# Patient Record
Sex: Male | Born: 1990 | Race: White | Hispanic: No | Marital: Single | Smoking: Never smoker
Health system: Southern US, Community
[De-identification: ages and names within clinical notes are randomized; demographics above are authoritative.]

## PROBLEM LIST (undated history)

## (undated) DIAGNOSIS — T82110A Breakdown (mechanical) of cardiac electrode, initial encounter: Secondary | ICD-10-CM

## (undated) DIAGNOSIS — I509 Heart failure, unspecified: Secondary | ICD-10-CM

## (undated) DIAGNOSIS — I472 Ventricular tachycardia, unspecified: Secondary | ICD-10-CM

## (undated) DIAGNOSIS — T827XXA Infection and inflammatory reaction due to other cardiac and vascular devices, implants and grafts, initial encounter: Secondary | ICD-10-CM

## (undated) DIAGNOSIS — Q251 Coarctation of aorta: Secondary | ICD-10-CM

## (undated) DIAGNOSIS — E669 Obesity, unspecified: Secondary | ICD-10-CM

## (undated) DIAGNOSIS — Z95810 Presence of automatic (implantable) cardiac defibrillator: Secondary | ICD-10-CM

## (undated) DIAGNOSIS — I420 Dilated cardiomyopathy: Secondary | ICD-10-CM

## (undated) DIAGNOSIS — I10 Essential (primary) hypertension: Secondary | ICD-10-CM

## (undated) DIAGNOSIS — I519 Heart disease, unspecified: Secondary | ICD-10-CM

## (undated) DIAGNOSIS — I4891 Unspecified atrial fibrillation: Secondary | ICD-10-CM

## (undated) DIAGNOSIS — I5189 Other ill-defined heart diseases: Secondary | ICD-10-CM

## (undated) HISTORY — PX: ICD IMPLANT: EP1208

## (undated) HISTORY — DX: Unspecified atrial fibrillation: I48.91

## (undated) HISTORY — DX: Obesity, unspecified: E66.9

## (undated) HISTORY — DX: Ventricular tachycardia, unspecified: I47.20

## (undated) HISTORY — DX: Infection and inflammatory reaction due to other cardiac and vascular devices, implants and grafts, initial encounter: T82.7XXA

## (undated) HISTORY — DX: Presence of automatic (implantable) cardiac defibrillator: Z95.810

## (undated) HISTORY — DX: Other ill-defined heart diseases: I51.89

## (undated) HISTORY — DX: Heart failure, unspecified: I50.9

## (undated) HISTORY — DX: Heart disease, unspecified: I51.9

## (undated) HISTORY — DX: Ventricular tachycardia: I47.2

## (undated) HISTORY — DX: Coarctation of aorta: Q25.1

## (undated) HISTORY — DX: Breakdown (mechanical) of cardiac electrode, initial encounter: T82.110A

## (undated) HISTORY — DX: Essential (primary) hypertension: I10

## (undated) HISTORY — DX: Dilated cardiomyopathy: I42.0

## (undated) HISTORY — PX: ICD GENERATOR REMOVAL: EP1232

---
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
## 2010-02-16 ENCOUNTER — Ambulatory Visit: Payer: Self-pay | Admitting: Urology

## 2013-06-06 HISTORY — PX: THORACIC AORTA STENT: SHX2498

## 2014-05-01 DIAGNOSIS — Z95810 Presence of automatic (implantable) cardiac defibrillator: Secondary | ICD-10-CM | POA: Insufficient documentation

## 2015-06-13 ENCOUNTER — Encounter: Payer: Self-pay | Admitting: Unknown Physician Specialty

## 2017-01-31 HISTORY — PX: CARDIAC CATHETERIZATION: SHX172

## 2017-02-06 DIAGNOSIS — I519 Heart disease, unspecified: Secondary | ICD-10-CM

## 2017-02-06 DIAGNOSIS — I4891 Unspecified atrial fibrillation: Secondary | ICD-10-CM | POA: Insufficient documentation

## 2017-02-06 DIAGNOSIS — I420 Dilated cardiomyopathy: Secondary | ICD-10-CM | POA: Insufficient documentation

## 2017-02-06 DIAGNOSIS — I472 Ventricular tachycardia, unspecified: Secondary | ICD-10-CM | POA: Insufficient documentation

## 2017-02-06 DIAGNOSIS — I509 Heart failure, unspecified: Secondary | ICD-10-CM | POA: Insufficient documentation

## 2017-02-06 DIAGNOSIS — I5189 Other ill-defined heart diseases: Secondary | ICD-10-CM

## 2017-02-06 DIAGNOSIS — I10 Essential (primary) hypertension: Secondary | ICD-10-CM | POA: Insufficient documentation

## 2017-02-06 DIAGNOSIS — Q251 Coarctation of aorta: Secondary | ICD-10-CM | POA: Insufficient documentation

## 2017-02-06 DIAGNOSIS — I429 Cardiomyopathy, unspecified: Secondary | ICD-10-CM | POA: Insufficient documentation

## 2017-02-06 DIAGNOSIS — E669 Obesity, unspecified: Secondary | ICD-10-CM | POA: Insufficient documentation

## 2017-02-13 ENCOUNTER — Telehealth: Payer: Self-pay | Admitting: Family Medicine

## 2017-02-13 ENCOUNTER — Ambulatory Visit (INDEPENDENT_AMBULATORY_CARE_PROVIDER_SITE_OTHER): Payer: BLUE CROSS/BLUE SHIELD | Admitting: Family Medicine

## 2017-02-13 ENCOUNTER — Encounter: Payer: Self-pay | Admitting: Family Medicine

## 2017-02-13 VITALS — BP 89/57 | HR 64 | Temp 98.8°F

## 2017-02-13 DIAGNOSIS — I4891 Unspecified atrial fibrillation: Secondary | ICD-10-CM | POA: Diagnosis not present

## 2017-02-13 DIAGNOSIS — Z5189 Encounter for other specified aftercare: Secondary | ICD-10-CM

## 2017-02-13 LAB — COAGUCHEK XS/INR WAIVED
INR: 6 (ref 0.9–1.1)
Prothrombin Time: 71.6 s

## 2017-02-13 NOTE — Progress Notes (Signed)
BP (!) 89/57 (BP Location: Left Arm, Patient Position: Sitting, Cuff Size: Normal)   Pulse 64   Temp 98.8 F (37.1 C)   SpO2 98%    Subjective:    Patient ID: Gary Davila, male    DOB: October 15, 1990, 26 y.o.   MRN: [id number]  HPI: Gary Davila is a 26 y.o. male  Chief Complaint  Patient presents with  . Wound Check    Where pacemaker. It's been ithcing for a few days.    Patient presents today for wound check from pacemaker insertion. Area is itching and flaking quite a bit but no pain, redness, drainage, or swelling. Denies fevers, chills, sweats. Keeping area covered with gauze and paper tape. Has not been using ointments on it.   Also due for INR recheck. His coumadin is managed by specialist but since he's here he wants to get it checked. Currently on 10 mg 5 days per week and 7.5 mg 2 days per week. Last check was several weeks ago prior to surgery, where it was found to be high and he had to receive vit K prior to operation. Denies bleeding or bruising issues.   Past Medical History:  Diagnosis Date  . A-fib (HCC)   . Chronic CHF (congestive heart failure) (HCC)   . Coarctation of aorta   . Dilated cardiomyopathy (HCC)   . Hypertension   . ICD (implantable cardioverter-defibrillator) in place   . ICD (implantable cardioverter-defibrillator) lead failure   . Infection of pacemaker pocket (HCC)   . Left ventricular diastolic dysfunction, NYHA class 2   . Obesity   . Ventricular tachycardia [facility])    Social History   Social History  . Marital status: Single    Spouse name: N/A  . Number of children: N/A  . Years of education: N/A   Occupational History  . Not on file.   Social History Main Topics  . Smoking status: Never Smoker  . Smokeless tobacco: Never Used  . Alcohol use No  . Drug use: No  . Sexual activity: Not on file   Other Topics Concern  . Not on file   Social History Narrative  . No narrative on file    Relevant past medical, surgical,  family and social history reviewed and updated as indicated. Interim medical history since our last visit reviewed. Allergies and medications reviewed and updated.  Review of Systems  Constitutional: Negative.   HENT: Negative.   Eyes: Negative.   Respiratory: Negative.   Cardiovascular: Negative.   Gastrointestinal: Negative.   Genitourinary: Negative.   Musculoskeletal: Negative.   Skin: Positive for wound.  Neurological: Negative.   Hematological: Does not bruise/bleed easily.  Psychiatric/Behavioral: Negative.    Per HPI unless specifically indicated above     Objective:    BP (!) 89/57 (BP Location: Left Arm, Patient Position: Sitting, Cuff Size: Normal)   Pulse 64   Temp 98.8 F (37.1 C)   SpO2 98%   Wt Readings from Last 3 Encounters:  No data found for Wt    Physical Exam  Constitutional: He appears well-developed and well-nourished. No distress.  HENT:  Head: Atraumatic.  Eyes: Pupils are equal, round, and reactive to light. Conjunctivae are normal. No scleral icterus.  Neck: Normal range of motion. Neck supple.  Cardiovascular: Normal rate and normal heart sounds.   Pulmonary/Chest: Effort normal and breath sounds normal. No respiratory distress.  Musculoskeletal:  In wheelchair  Lymphadenopathy:    He has no cervical  adenopathy.  Neurological: He is alert.  Skin: Skin is warm and dry.  Pacemaker insertion site dry, flaking, but healing well with no evidence of infection.   Psychiatric:  Flat affect  Nursing note and vitals reviewed.   Results for orders placed or performed in visit on 02/13/17  CoaguChek XS/INR Waived  Result Value Ref Range   INR 6.0 (>) 0.9 - 1.1   Prothrombin Time 71.6 sec      Assessment & Plan:   Problem List Items Addressed This Visit      Cardiovascular and Mediastinum   A-fib (HCC) - Primary    INR of 6.0 today. Recommended to hold tonight's dose, then decrease to 7.5 mg daily and call specialist. Pt's parents state  they will call specialist with results today and get a recommendation.       Relevant Medications   carvedilol (COREG) 25 MG tablet   warfarin (COUMADIN) 5 MG tablet   furosemide (LASIX) 20 MG tablet   eplerenone (INSPRA) 25 MG tablet   digoxin (LANOXIN) 0.125 MG tablet   sacubitril-valsartan (ENTRESTO) 24-26 MG   Other Relevant Orders   CoaguChek XS/INR Waived (Completed)    Other Visit Diagnoses    Visit for wound check       Surgical site healing well, recommended moisturizing area with a gentle, unscented cream to help with the itching. Mild soap and water daily       Follow up plan: Return in about 2 weeks (around 02/27/2017) for Recheck INR.

## 2017-02-13 NOTE — Telephone Encounter (Signed)
Patients father notified.

## 2017-02-13 NOTE — Telephone Encounter (Signed)
Please call and let him know the lab just notified us of a possible accuracy issue with results over 4.5 and that the recommendation is a re-draw. I will put the order in if he wants to come back in tomorrow or the next day

## 2017-02-14 ENCOUNTER — Other Ambulatory Visit: Payer: BLUE CROSS/BLUE SHIELD

## 2017-02-14 DIAGNOSIS — I4891 Unspecified atrial fibrillation: Secondary | ICD-10-CM

## 2017-02-14 LAB — COAGUCHEK XS/INR WAIVED
INR: 4.8 — ABNORMAL HIGH (ref 0.9–1.1)
PROTHROMBIN TIME: 57.8 s

## 2017-02-16 NOTE — Assessment & Plan Note (Signed)
INR of 6.0 today. Recommended to hold tonight's dose, then decrease to 7.5 mg daily and call specialist. Pt's parents state they will call specialist with results today and get a recommendation.

## 2017-02-16 NOTE — Patient Instructions (Signed)
Follow-up in 2 weeks

## 2017-02-20 ENCOUNTER — Other Ambulatory Visit: Payer: Self-pay

## 2017-02-21 LAB — COAGUCHEK XS/INR WAIVED
INR: 2.8 — ABNORMAL HIGH (ref 0.9–1.1)
PROTHROMBIN TIME: 34.1 s

## 2017-02-28 ENCOUNTER — Encounter: Payer: Self-pay | Admitting: Family Medicine

## 2017-02-28 ENCOUNTER — Ambulatory Visit (INDEPENDENT_AMBULATORY_CARE_PROVIDER_SITE_OTHER): Payer: BLUE CROSS/BLUE SHIELD | Admitting: Family Medicine

## 2017-02-28 ENCOUNTER — Telehealth: Payer: Self-pay | Admitting: Unknown Physician Specialty

## 2017-02-28 VITALS — BP 97/60 | HR 96 | Temp 98.3°F | Ht 65.0 in | Wt 214.0 lb

## 2017-02-28 DIAGNOSIS — T8149XA Infection following a procedure, other surgical site, initial encounter: Secondary | ICD-10-CM

## 2017-02-28 DIAGNOSIS — I4891 Unspecified atrial fibrillation: Secondary | ICD-10-CM

## 2017-02-28 LAB — COAGUCHEK XS/INR WAIVED
INR: 2.5 — ABNORMAL HIGH (ref 0.9–1.1)
Prothrombin Time: 30.2 s

## 2017-02-28 MED ORDER — DOXYCYCLINE HYCLATE 100 MG PO TABS: 100.0000 mg | ORAL_TABLET | Freq: Two times a day (BID) | ORAL | 0 refills | Status: DC

## 2017-02-28 NOTE — Progress Notes (Signed)
BP 97/60 (BP Location: Right Arm, Patient Position: Sitting, Cuff Size: Normal)   Pulse 96   Temp 98.3 F (36.8 C) (Oral)   Ht 5\' 5"  (1.651 m)   Wt 214 lb (97.1 kg)   SpO2 96%   BMI 35.61 kg/m    Subjective:    Patient ID: Gary Davila, male    DOB: Sep 15, 1990, 26 y.o.   MRN: [id number]  HPI: Gary Davila is a 26 y.o. male  Chief Complaint  Patient presents with  . Coagulation Disorder    PT/INR Check. Taking 7.5mg  on Tuesdays and Thursdays and 5mg  all other days. (Coumadin)   Patient presents today for INR recheck. Taking 7.5 mg coumadin Tues Thurs and 5 mg all other days. Coumadin dosing [facility], but they would like to keep getting them drawn here to [facility]. No bleeding or bruising issues, no new medicines or dietary changes.  Also wanting pacemaker incision site to be checked as they fear he's developing a wound infection. No fevers, chills, sweats, but having some drainage and redness in one area.   Past Medical History:  Diagnosis Date  . A-fib (HCC)   . Chronic CHF (congestive heart failure) (HCC)   . Coarctation of aorta   . Dilated cardiomyopathy (HCC)   . Hypertension   . ICD (implantable cardioverter-defibrillator) in place   . ICD (implantable cardioverter-defibrillator) lead failure   . Infection of pacemaker pocket (HCC)   . Left ventricular diastolic dysfunction, NYHA class 2   . Obesity   . Ventricular tachycardia (HCC)    Social History   Socioeconomic History  . Marital status: Single    Spouse name: Not on file  . Number of children: Not on file  . Years of education: Not on file  . Highest education level: Not on file  Social Needs  . Financial resource strain: Not on file  . Food insecurity - worry: Not on file  . Food insecurity - inability: Not on file  . Transportation needs - medical: Not on file  . Transportation needs - non-medical: Not on file  Occupational  History  . Not on file  Tobacco Use  . Smoking status: Never Smoker  . Smokeless tobacco: Never Used  Substance and Sexual Activity  . Alcohol use: No  . Drug use: No  . Sexual activity: Not on file  Other Topics Concern  . Not on file  Social History Narrative  . Not on file   Relevant past medical, surgical, family and social history reviewed and updated as indicated. Interim medical history since our last visit reviewed. Allergies and medications reviewed and updated.  Review of Systems  Constitutional: Negative.   HENT: Negative.   Respiratory: Negative.   Cardiovascular: Negative.   Gastrointestinal: Negative.   Genitourinary: Negative.   Musculoskeletal: Negative.   Skin: Positive for wound.  Neurological: Negative.   Hematological: Does not bruise/bleed easily.  Psychiatric/Behavioral: Negative.    Per HPI unless specifically indicated above     Objective:    BP 97/60 (BP Location: Right Arm, Patient Position: Sitting, Cuff Size: Normal)   Pulse 96   Temp 98.3 F (36.8 C) (Oral)   Ht 5\' 5"  (1.651 m)   Wt 214 lb (97.1 kg)   SpO2 96%   BMI 35.61 kg/m   Wt Readings from Last 3 Encounters:  02/28/17 214 lb (97.1 kg)  Physical Exam  Constitutional: He is oriented to person, place, and time. He appears well-developed and well-nourished. No distress.  HENT:  Head: Atraumatic.  Eyes: Conjunctivae are normal. Pupils are equal, round, and reactive to light. No scleral icterus.  Neck: Normal range of motion. Neck supple.  Cardiovascular: Normal rate.  Pulmonary/Chest: Effort normal. No respiratory distress.  Neurological: He is alert and oriented to person, place, and time.  Skin: Skin is warm and dry.  Incision site for pacemaker well healed other than an infection developing at medial end of incision. Appears to be from an internal stitch that has come up to the surface and partially come through the skin.   Psychiatric: He has a normal mood and affect. His  behavior is normal.  Nursing note and vitals reviewed.  Results for orders placed or performed in visit on 02/28/17  CoaguChek XS/INR Waived  Result Value Ref Range   INR 2.5 (H) 0.9 - 1.1   Prothrombin Time 30.2 sec      Assessment & Plan:   Problem List Items Addressed This Visit      Cardiovascular and Mediastinum   A-fib (HCC) - Primary    Discussed at length for clarification that my role in his anticoagulation treatment is simply to check the levels so they can report to Cardiologist - patient and his father agree with this. INR stable today on current dose, they will send results to Cardiologist and schedule f/u INR per his instructions. Did note to inform Cardiology about starting doxycycline for wound infection in case they want closer f/u while on the medication      Relevant Orders   CoaguChek XS/INR Waived (Completed)    Other Visit Diagnoses    Wound infection after surgery       Wound previously healing well, now developing small area of infection from internal stitch popping through. F/u w/ surgeon for check ASAP, 10 days of doxy sent    Discussed keeping wound covered with neosporin and a bandage until f/u with surgeon.    Follow up plan: Return in about 4 weeks (around 03/28/2017).

## 2017-02-28 NOTE — Telephone Encounter (Signed)
Copied from CRM #[id number]. Topic: Quick Communication - See Telephone Encounter >> Feb 28, 2017 11:52 AM Rudi CocoLathan, Landan Fedie M, NT wrote: CRM for notification. See Telephone encounter for:   02/28/17. Pt. Dad called and said insurance information was put in incorrectly but pt. Does have [employer] insurance please call if any questions [phone]

## 2017-03-01 NOTE — Telephone Encounter (Signed)
Spoke with pt and his dad and he explained that it was a mistake and the insurance was correct.

## 2017-03-03 NOTE — Patient Instructions (Signed)
Follow up as directed by Cardiology

## 2017-03-03 NOTE — Assessment & Plan Note (Addendum)
Discussed at length for clarification that my role in his anticoagulation treatment is simply to check the levels so they can report to Cardiologist - patient and his father agree with this. INR stable today on current dose, they will send results to Cardiologist and schedule f/u INR per his instructions. Did note to inform Cardiology about starting doxycycline for wound infection in case they want closer f/u while on the medication

## 2017-03-28 ENCOUNTER — Ambulatory Visit: Payer: BLUE CROSS/BLUE SHIELD | Admitting: Family Medicine

## 2017-05-06 ENCOUNTER — Ambulatory Visit (INDEPENDENT_AMBULATORY_CARE_PROVIDER_SITE_OTHER): Payer: Self-pay | Admitting: Family Medicine

## 2017-05-06 VITALS — BP 100/71 | HR 44 | Temp 98.4°F

## 2017-05-06 DIAGNOSIS — Z95810 Presence of automatic (implantable) cardiac defibrillator: Secondary | ICD-10-CM

## 2017-05-06 DIAGNOSIS — I482 Chronic atrial fibrillation, unspecified: Secondary | ICD-10-CM

## 2017-05-06 LAB — COAGUCHEK XS/INR WAIVED
INR: 4.4 — ABNORMAL HIGH (ref 0.9–1.1)
PROTHROMBIN TIME: 53.4 s

## 2017-05-06 NOTE — Progress Notes (Addendum)
BP 100/71 (BP Location: Left Arm, Patient Position: Sitting, Cuff Size: Normal)   Pulse (!) 44   Temp 98.4 F (36.9 C) (Oral)   SpO2 97%    Subjective:    Patient ID: Gary Davila, male    DOB: 27-Oct-1990, 27 y.o.   MRN: [id number]  HPI: Gary Davila is a 27 y.o. male  Chief Complaint  Patient presents with  . Coagulation Disorder   Pt here today for INR check. His INR is managed through his Cardiologist but pt prefers it to be drawn here as the commute to his Cardiologist is long. Currently taking 7.5 mg coumadin on Tu/Thu and 5 mg all other days. No bleeding or bruising issues reported by pt. Pt reports he has been in his usual state of health and none of his medications have changed.   Relevant past medical, surgical, family and social history reviewed and updated as indicated. Interim medical history since our last visit reviewed. Allergies and medications reviewed and updated.  Review of Systems  Constitutional: Negative.   Respiratory: Negative.   Cardiovascular: Negative.   Gastrointestinal: Negative.   Musculoskeletal: Negative.   Skin: Negative.   Neurological: Negative.   Hematological: Does not bruise/bleed easily.  Psychiatric/Behavioral: Negative.     Per HPI unless specifically indicated above     Objective:    BP 100/71 (BP Location: Left Arm, Patient Position: Sitting, Cuff Size: Normal)   Pulse (!) 44   Temp 98.4 F (36.9 C) (Oral)   SpO2 97%   Wt Readings from Last 3 Encounters:  02/28/17 214 lb (97.1 kg)    Physical Exam  Constitutional: He is oriented to person, place, and time.  Appears pale, chronically ill  HENT:  Head: Atraumatic.  Eyes: Conjunctivae are normal. No scleral icterus.  Neck: Normal range of motion. Neck supple.  Cardiovascular:  Bradycardic rate  Pulmonary/Chest: Effort normal and breath sounds normal. No respiratory distress.  Musculoskeletal:  Walks with assistive devices  Neurological: He is alert and oriented  to person, place, and time.  Skin: Skin is warm and dry. There is pallor (unsure of baseline).  Psychiatric:  Flat affect  Nursing note and vitals reviewed.  Results for orders placed or performed in visit on 05/06/17  CoaguChek XS/INR Waived  Result Value Ref Range   INR 4.4 (H) 0.9 - 1.1   Prothrombin Time 53.4 sec      Assessment & Plan:   Problem List Items Addressed This Visit      Cardiovascular and Mediastinum   A-fib (HCC) - Primary    INR is 4.4 today. Will send results to Cardiologist for management and pt given copy of results stating they will also contact Cardiologist today. Declines dosing advice from this office regarding coumadin adjustments, only wanting Cardiology to manage levels.       Relevant Orders   CoaguChek XS/INR Waived (Completed)     Other   ICD (implantable cardioverter-defibrillator) in place    Patient's pulse on arrival was found to be between 33-44 after multiple devices used to check and multiple different times, including sitting, standing with manual machine and pulse oximeter as well as manually by CMA. Pt declines dizziness, diaphoresis, or faintness and states he feels perfectly fine. Cardiologist was called and CMA spoke to nurse there who advised us to call 911 as he has a hx of lad failure in his device and has had multiple episodes like this. Pt was informed of this advice and 911  was called per Cardiology advisement. On arrival, EMTs found pulse to be 67 bpm and pt declined going to the hospital. He states he will f/u with his Cardiologist ASAP instead. Reiterated that Cardiologist had advised 911 to [facility], pt declines.           Follow up plan: Return for Cardiology f/u.

## 2017-05-07 ENCOUNTER — Telehealth: Payer: Self-pay

## 2017-05-07 NOTE — Telephone Encounter (Signed)
I took patient's blood pressure and the reading was 33.  I rechecked with the pulse ox meter and it was measuring 35-50.  I went and grabbed TildenRachel and we got the patient's pulse standing up and it registered 39. I then took it manually from left wrist and the I got 11 in 15 seconds measuring 44. Patient stated that his monitor is supposed to shock him if it's under 40.  I called [facility] and spoke with a nurse and read the nurse the levels and she said to call 911. The nurse stated that it was supposed to shock him if his pulse went under 60, not 40 and he had a history of lead failure and different devices.

## 2017-05-07 NOTE — Telephone Encounter (Signed)
Routing to provider  

## 2017-05-07 NOTE — Telephone Encounter (Signed)
Will forward message to Practice Administrator for follow-up Vitals machine calibrated last 07/2016, will be due this coming April 2019 for re-calibration.   Pt presented and vitals were checked both sitting and standing multiple times as well as with the machine's pulse oximeter by the CMA with results from 33 bmp to 48 bpm. Pulse was then checked manual and found to be consistent with these by CMA. Pt denied dizziness, diaphoresis, CP, and states his ICD had not delivered a shock.   CMA called pt's Cardiologist and was advised by nurse to call 911 for pt and have [facility] as he has a hx of lead failure on his device and the device is supposed to send a shock for any HR under 60 bpm.   911 was called per advice of Cardiology office.

## 2017-05-07 NOTE — Telephone Encounter (Signed)
Copied from CRM [phone]. Topic: General - Other >> May 07, 2017 10:42 AM Crist InfanteHarrald, Kathy J wrote: Reason for CRM: dad states when pt got home yesterday, pt connected directly with [facility] and they did not find any readings as low as you got yesterday for the heart rate.  Dad feels like you may have faughty equipment, sending false readings.  Pt's pulse with [facility] has recorded no less than 55 in the last 30 days. (pt has a defibrillator) Pt is going great and no signs that heart rate is anything but good.  Pt has standing appt next month with cardiologist

## 2017-05-09 ENCOUNTER — Telehealth: Payer: Self-pay | Admitting: Family Medicine

## 2017-05-09 ENCOUNTER — Encounter: Payer: Self-pay | Admitting: Family Medicine

## 2017-05-09 DIAGNOSIS — Z95810 Presence of automatic (implantable) cardiac defibrillator: Secondary | ICD-10-CM | POA: Insufficient documentation

## 2017-05-09 NOTE — Patient Instructions (Signed)
Follow up with Cardiology

## 2017-05-09 NOTE — Assessment & Plan Note (Signed)
INR is 4.4 today. Will send results to Cardiologist for management and pt given copy of results stating they will also contact Cardiologist today. Declines dosing advice from this office regarding coumadin adjustments, only wanting Cardiology to manage levels.

## 2017-05-09 NOTE — Assessment & Plan Note (Signed)
Patient's pulse on arrival was found to be between 33-44 after multiple devices used to check and multiple different times, including sitting, standing with manual machine and pulse oximeter as well as manually by CMA. Pt declines dizziness, diaphoresis, or faintness and states he feels perfectly fine. Cardiologist was called and CMA spoke to nurse there who advised us to call 911 as he has a hx of lad failure in his device and has had multiple episodes like this. Pt was informed of this advice and 911 was called per Cardiology advisement. On arrival, EMTs found pulse to be 67 bpm and pt declined going to the hospital. He states he will f/u with his Cardiologist ASAP instead. Reiterated that Cardiologist had advised 911 to [facility], pt declines.

## 2017-05-09 NOTE — Telephone Encounter (Signed)
INR Faxed

## 2017-05-09 NOTE — Telephone Encounter (Signed)
Please fax the recent INR results over to his Cardiologist for review

## 2017-06-03 NOTE — Addendum Note (Signed)
Addended by: Roosvelt MaserLANE, Shontell Prosser E on: 06/03/2017 01:42 PM   Modules accepted: Level of Service

## 2019-02-25 ENCOUNTER — Encounter: Payer: Medicaid Other | Attending: Cardiology | Admitting: *Deleted

## 2019-02-25 ENCOUNTER — Other Ambulatory Visit: Payer: Self-pay

## 2019-02-25 DIAGNOSIS — I420 Dilated cardiomyopathy: Secondary | ICD-10-CM | POA: Insufficient documentation

## 2019-02-25 DIAGNOSIS — E669 Obesity, unspecified: Secondary | ICD-10-CM | POA: Insufficient documentation

## 2019-02-25 DIAGNOSIS — I5022 Chronic systolic (congestive) heart failure: Secondary | ICD-10-CM | POA: Insufficient documentation

## 2019-02-25 DIAGNOSIS — I110 Hypertensive heart disease with heart failure: Secondary | ICD-10-CM | POA: Insufficient documentation

## 2019-02-25 DIAGNOSIS — Z7901 Long term (current) use of anticoagulants: Secondary | ICD-10-CM | POA: Insufficient documentation

## 2019-02-25 DIAGNOSIS — I472 Ventricular tachycardia: Secondary | ICD-10-CM | POA: Insufficient documentation

## 2019-02-25 DIAGNOSIS — I4891 Unspecified atrial fibrillation: Secondary | ICD-10-CM | POA: Insufficient documentation

## 2019-02-25 DIAGNOSIS — Z79899 Other long term (current) drug therapy: Secondary | ICD-10-CM | POA: Insufficient documentation

## 2019-02-25 DIAGNOSIS — Z95810 Presence of automatic (implantable) cardiac defibrillator: Secondary | ICD-10-CM | POA: Insufficient documentation

## 2019-02-25 NOTE — Progress Notes (Signed)
Virtual Orientation Completed. Documentation for diagnosis can be found in [facility] encounter 02/11/19. EP eval scheduled for 11/17 at 330pm

## 2019-03-04 ENCOUNTER — Ambulatory Visit (INDEPENDENT_AMBULATORY_CARE_PROVIDER_SITE_OTHER): Payer: Medicaid Other | Admitting: Family Medicine

## 2019-03-04 ENCOUNTER — Other Ambulatory Visit: Payer: Self-pay

## 2019-03-04 ENCOUNTER — Encounter: Payer: Self-pay | Admitting: Family Medicine

## 2019-03-04 VITALS — BP 96/58 | HR 63 | Temp 98.3°F | Resp 16 | Ht 68.0 in | Wt 203.0 lb

## 2019-03-04 DIAGNOSIS — I482 Chronic atrial fibrillation, unspecified: Secondary | ICD-10-CM

## 2019-03-04 DIAGNOSIS — I420 Dilated cardiomyopathy: Secondary | ICD-10-CM | POA: Diagnosis not present

## 2019-03-04 DIAGNOSIS — Z23 Encounter for immunization: Secondary | ICD-10-CM | POA: Diagnosis not present

## 2019-03-04 DIAGNOSIS — Z7689 Persons encountering health services in other specified circumstances: Secondary | ICD-10-CM

## 2019-03-04 DIAGNOSIS — I5020 Unspecified systolic (congestive) heart failure: Secondary | ICD-10-CM

## 2019-03-04 DIAGNOSIS — Z95810 Presence of automatic (implantable) cardiac defibrillator: Secondary | ICD-10-CM

## 2019-03-04 DIAGNOSIS — I429 Cardiomyopathy, unspecified: Secondary | ICD-10-CM

## 2019-03-04 DIAGNOSIS — Q251 Coarctation of aorta: Secondary | ICD-10-CM | POA: Diagnosis not present

## 2019-03-04 DIAGNOSIS — E669 Obesity, unspecified: Secondary | ICD-10-CM

## 2019-03-04 NOTE — Progress Notes (Signed)
Subjective:    Patient ID: Gary Davila, male    DOB: 01/05/1991, 28 y.o.   MRN: [id number]  Gary Davila is a 28 y.o. male presenting on 03/04/2019 for Establish Care (legs weak and pain) and Edema  Switching PCP now locally, previous at [facility].  HPI   History provided by patient, and his aunt Gary Davila, and mother Gary Davila.  Congestive Heart Failure with Reduced EF, secondary to NICM, Coarctation of the Aorta Followed by [facility] Dr Edwena Blow and Dr Jackson Latino (Peds Cards) Background history since age 28, diagnosed with heart disease after he had a bad cold that did not resolve - Prior ECHO 2019, shows severely dilated LV with EF 15% - History of blood clot in heart, required chronic anticoagulation, on coumadin rx by Cardiology - Admits chronic swelling, in lower extremity - Admits a generalized weakness in lower extremities and muscles generally, limited activity short distances walking due to severity of his heart failure, he is anticipating and proceeding with Cardiac Rehab for strengthening, goal x 3 weekly. - Admits some pain in feet at times, worse at heel with activity Wt loss 30 lbs   Health Maintenance: Due for Flu Shot, will receive today    Depression screen PHQ 2/9 03/04/2019  Decreased Interest 0  Down, Depressed, Hopeless 0  PHQ - 2 Score 0    Past Medical History:  Diagnosis Date  . A-fib (HCC)   . Chronic CHF (congestive heart failure) (HCC)   . Coarctation of aorta   . Dilated cardiomyopathy (HCC)   . Hypertension   . ICD (implantable cardioverter-defibrillator) in place   . ICD (implantable cardioverter-defibrillator) lead failure   . Infection of pacemaker pocket (HCC)   . Left ventricular diastolic dysfunction, NYHA class 2   . Obesity   . Ventricular tachycardia [facility])    Past Surgical History:  Procedure Laterality Date  . CARDIAC CATHETERIZATION  01/31/2017  . ICD GENERATOR REMOVAL  11/29/09, 12/27/09, 01/31/17  . ICD  IMPLANT  06/24/03, 02/22/10, 01/31/17  . THORACIC AORTA STENT  06/06/2013   Social History   Socioeconomic History  . Marital status: Single    Spouse name: Not on file  . Number of children: Not on file  . Years of education: College  . Highest education level: Not on file  Occupational History  . Not on file  Social Needs  . Financial resource strain: Not on file  . Food insecurity    Worry: Not on file    Inability: Not on file  . Transportation needs    Medical: Not on file    Non-medical: Not on file  Tobacco Use  . Smoking status: Never Smoker  . Smokeless tobacco: Never Used  Substance and Sexual Activity  . Alcohol use: No  . Drug use: No  . Sexual activity: Not on file  Lifestyle  . Physical activity    Days per week: Not on file    Minutes per session: Not on file  . Stress: Not on file  Relationships  . Social Musician on phone: Not on file    Gets together: Not on file    Attends religious service: Not on file    Active member of club or organization: Not on file    Attends meetings of clubs or organizations: Not on file    Relationship status: Not on file  . Intimate partner violence    Fear of current or ex partner: Not  on file    Emotionally abused: Not on file    Physically abused: Not on file    Forced sexual activity: Not on file  Other Topics Concern  . Not on file  Social History Narrative  . Not on file   Family History  Problem Relation Age of Onset  . Cancer Maternal Grandmother   . Stroke Maternal Grandmother   . Heart attack Father 51   Current Outpatient Medications on File Prior to Visit  Medication Sig  . carvedilol (COREG) 25 MG tablet TAKE (1) TABLET TWICE A DAY.  . digoxin (LANOXIN) 0.125 MG tablet TAKE 1 TABLET DAILY.  Marland Kitchen eplerenone (INSPRA) 25 MG tablet TAKE 1 TABLET DAILY.  . furosemide (LASIX) 20 MG tablet Take 40 mg by mouth 2 (two) times daily.  . sacubitril-valsartan (ENTRESTO) 24-26 MG Take by mouth.  .  warfarin (COUMADIN) 5 MG tablet Take 7.5mg  (one and half pill of 5mg ) on Tues / Thurs, then 5mg  every other day.  . empagliflozin (JARDIANCE) 10 MG TABS tablet Take by mouth.  Marland Kitchen ibuprofen (ADVIL,MOTRIN) 200 MG tablet Take by mouth.   No current facility-administered medications on file prior to visit.     Review of Systems Per HPI unless specifically indicated above     Objective:    BP (!) 96/58   Pulse 63   Temp 98.3 F (36.8 C) (Oral)   Resp 16   Ht 5\' 8"  (1.727 m)   Wt 203 lb (92.1 kg)   BMI 30.87 kg/m   Wt Readings from Last 3 Encounters:  03/04/19 203 lb (92.1 kg)  02/28/17 214 lb (97.1 kg)    Physical Exam Vitals signs and nursing note reviewed.  Constitutional:      General: He is not in acute distress.    Appearance: He is well-developed. He is not diaphoretic.     Comments: Well-appearing, comfortable, cooperative  HENT:     Head: Normocephalic and atraumatic.  Eyes:     General:        Right eye: No discharge.        Left eye: No discharge.     Conjunctiva/sclera: Conjunctivae normal.  Cardiovascular:     Rate and Rhythm: Normal rate. Rhythm irregular.  Pulmonary:     Effort: Pulmonary effort is normal.  Musculoskeletal:     Right lower leg: Edema (+1 pitting edema ankle to foot) present.     Left lower leg: Edema (+1-2 pitting edema ankle to foot) present.     Comments: No erythema of lower extremity  Bilateral knees without deformity, has very tight crepitus sensation on L > R knee, but has full active ROM. No effusion, non tender joint line.  Slight muscle atrophy of lower extremities bilateral.  Muscle strength knee flex ext 4/5 with some limited effort.  Foot exam Bilateral dry flaky skin. Edema. No erythema. No ulceration. Area of tenderness L achilles region non specific. Not tender to touch over heel or rest of foot. Foot arch appears mostly preserved.  Skin:    General: Skin is warm and dry.     Findings: No erythema or rash.   Neurological:     Mental Status: He is alert and oriented to person, place, and time.  Psychiatric:        Behavior: Behavior normal.     Comments: Well groomed, good eye contact, normal speech and thoughts      Results for orders placed or performed in visit on 05/06/17  CoaguChek  XS/INR Waived  Result Value Ref Range   INR 4.4 (H) 0.9 - 1.1   Prothrombin Time 53.4 sec      Assessment & Plan:   Problem List Items Addressed This Visit    Obesity (BMI 30.0-34.9)   ICD (implantable cardioverter-defibrillator) in place   Heart failure with reduced ejection fraction due to cardiomyopathy (HCC)   Relevant Medications   carvedilol (COREG) 25 MG tablet   digoxin (LANOXIN) 0.125 MG tablet   eplerenone (INSPRA) 25 MG tablet   Dilated cardiomyopathy (HCC) - Primary   Relevant Medications   carvedilol (COREG) 25 MG tablet   digoxin (LANOXIN) 0.125 MG tablet   eplerenone (INSPRA) 25 MG tablet   Coarctation of aorta   Relevant Medications   carvedilol (COREG) 25 MG tablet   digoxin (LANOXIN) 0.125 MG tablet   eplerenone (INSPRA) 25 MG tablet   A-fib (HCC)   Relevant Medications   carvedilol (COREG) 25 MG tablet   digoxin (LANOXIN) 0.125 MG tablet   eplerenone (INSPRA) 25 MG tablet    Other Visit Diagnoses    Encounter to establish care with new doctor       Needs flu shot       Relevant Orders   [facility] - Flu Vaccine QUAD 36+ mos PF IM (Fluarix & Fluzone Quad PF) (Completed)    [facility] outside [facility]   Complex chronic history of cardiovascular disease S/p AICD [facility] On medication management for volume control / fluid balance / CHF management Anticoagulation with coumadin, per Cardiology Recently Jardiance   Encourage patient to proceed with Cardiac Rehab for strengthening, goal to improve mobility and function, sequela of limited activity with foot pain and weakness. Likely tendonitis or tendinopathy issue with  achilles. Consider referral to Podiatry if needed  No orders of the defined types were placed in this encounter.    Follow up plan: Return if symptoms worsen or fail to improve.  Saralyn PilarAlexander Toddy Boyd, DO [facility] 03/04/2019, 3:38 PM

## 2019-03-04 NOTE — Patient Instructions (Addendum)
Thank you for coming to the office today.  We will review records from [facility]  Recommend proceeding with cardiac rehab as discussed.  I think that strengthening the muscles will help overall a lot of your problems with the knees ankles feet and legs.  Follow-up as needed - we can consider refer to Podiatry in future if needed   Please schedule a Follow-up Appointment to: Return if symptoms worsen or fail to improve.  If you have any other questions or concerns, please feel free to call the office or send a message through MyChart. You may also schedule an earlier appointment if necessary.  Additionally, you may be receiving a survey about your experience at our office within a few days to 1 week by e-mail or mail. We value your feedback.  Saralyn Pilar, DO [facility], [facility]              Plantar Fascia Stretches / Exercises  See other page with pictures of each exercise.  Start with 1 or 2 of these exercises that you are most comfortable with. Do not do any exercises that cause you significant worsening pain. Some of these may cause some "stretching soreness" but it should go away after you stop the exercise, and get better over time. Gradually increase up to 3-4 exercises as tolerated.  You may begin exercising the muscles of your foot right away by gently stretching them as follows:  Stretching: Towel stretch: Sit on a hard surface with your injured leg stretched out in front of you. Loop a towel around the ball of your foot and pull the towel toward your body keeping your knee straight. Hold this position for 15 to 30 seconds then relax. Repeat 3 times. When the towel stretch becomes to easy, you may begin doing the standing calf stretch.  Standing calf stretch: Facing a wall, put your hands against the wall at about eye level. Keep the injured leg back, the uninjured leg forward, and the heel of your injured leg on the floor. Turn your  injured foot slightly inward (as if you were pigeon-toed) as you slowly lean into the wall until you feel a stretch in the back of your calf. Hold for 15 to 30 seconds. Repeat 3 times. Do this exercise several times each day. When you can stand comfortably on your injured foot, you can begin stretching the bottom of your foot using the plantar fascia stretch.  Plantar fascia stretch: Stand with the ball of your injured foot on a stair. Reach for the bottom step with your heel until you feel a stretch in the arch of your foot. Hold this position for 15 to 30 seconds and then relax. Repeat 3 times. After you have stretched the bottom muscles of your foot, you can begin strengthening the top muscles of your foot.  Frozen can roll: Roll your bare injured foot back and forth from your heel to your mid-arch over a frozen juice can. Repeat for 3 to 5 minutes. This exercise is particularly helpful if done first thing in the morning. Towel pickup: With your heel on the ground, pick up a towel with your toes. Release. Repeat 10 to 20 times. When this gets easy, add more resistance by placing a book or small weight on the towel. Static and dynamic balance exercises Place a chair next to your non-injured leg and stand upright. (This will provide you with balance if needed.) Stand on your injured foot. Try to raise the  arch of your foot while keeping your toes on the floor. Try to maintain this position and balance on your injured side for 30 seconds. This exercise can be made more difficult by doing it on a piece of foam or a pillow, or with your eyes closed. Stand in the same position as above. Keep your foot in this position and reach forward in front of you with your injured side's hand, allowing your knee to bend. Repeat this 10 times while maintaining the arch height. This exercise can be made more difficult by reaching farther in front of you. Do 2 sets. Stand in the same position as above. While maintaining  your arch height, reach the injured side's hand across your body toward the chair. The farther you reach, the more challenging the exercise. Do 2 sets of 10.  Next, you can begin strengthening the muscles of your foot and lower leg by using elastic tubing.  Strengthening: Resisted dorsiflexion: Sit with your injured leg out straight and your foot facing a doorway. Tie a loop in one end of the tubing. Put your foot through the loop so that the tubing goes around the arch of your foot. Tie a knot in the other end of the tubing and shut the knot in the door. Move backward until there is tension in the tubing. Keeping your knee straight, pull your foot toward your body, stretching the tubing. Slowly return to the starting position. Do 3 sets of 10. Resisted plantar flexion: Sit with your leg outstretched and loop the middle section of the tubing around the ball of your foot. Hold the ends of the tubing in both hands. Gently press the ball of your foot down and point your toes, stretching the tubing. Return to the starting position. Do 3 sets of 10. Resisted inversion: Sit with your legs out straight and cross your uninjured leg over your injured ankle. Wrap the tubing around the ball of your injured foot and then loop it around your uninjured foot so that the tubing is anchored there at one end. Hold the other end of the tubing in your hand. Turn your injured foot inward and upward. This will stretch the tubing. Return to the starting position. Do 3 sets of 10. Resisted eversion: Sit with both legs stretched out in front of you, with your feet about a shoulder's width apart. Tie a loop in one end of the tubing. Put your injured foot through the loop so that the tubing goes around the arch of that foot and wraps around the outside of the uninjured foot. Hold onto the other end of the tubing with your hand to provide tension. Turn your injured foot up and out. Make sure you keep your uninjured foot still so that  it will allow the tubing to stretch as you move your injured foot. Return to the starting position. Do 3 sets of 10.

## 2019-03-10 DIAGNOSIS — Z95810 Presence of automatic (implantable) cardiac defibrillator: Secondary | ICD-10-CM | POA: Diagnosis not present

## 2019-03-10 DIAGNOSIS — I420 Dilated cardiomyopathy: Secondary | ICD-10-CM | POA: Diagnosis not present

## 2019-03-10 DIAGNOSIS — I4891 Unspecified atrial fibrillation: Secondary | ICD-10-CM | POA: Diagnosis not present

## 2019-03-10 DIAGNOSIS — I110 Hypertensive heart disease with heart failure: Secondary | ICD-10-CM | POA: Diagnosis present

## 2019-03-10 DIAGNOSIS — Z79899 Other long term (current) drug therapy: Secondary | ICD-10-CM | POA: Diagnosis not present

## 2019-03-10 DIAGNOSIS — I472 Ventricular tachycardia: Secondary | ICD-10-CM | POA: Diagnosis not present

## 2019-03-10 DIAGNOSIS — I5022 Chronic systolic (congestive) heart failure: Secondary | ICD-10-CM | POA: Diagnosis not present

## 2019-03-10 DIAGNOSIS — Z7901 Long term (current) use of anticoagulants: Secondary | ICD-10-CM | POA: Diagnosis not present

## 2019-03-10 DIAGNOSIS — E669 Obesity, unspecified: Secondary | ICD-10-CM | POA: Diagnosis not present

## 2019-03-10 NOTE — Patient Instructions (Signed)
Patient Instructions  Patient Details  Name: Gary Davila MRN: [id number] Date of Birth: March 25, 1991 Referring Provider:  Jetta Lout, MD  Below are your personal goals for exercise, nutrition, and risk factors. Our goal is to help you stay on track towards obtaining and maintaining these goals. We will be discussing your progress on these goals with you throughout the program.  Initial Exercise Prescription: Initial Exercise Prescription - 03/10/19 1700      Date of Initial Exercise RX and Referring Provider   Date  03/10/19    Referring Provider  Devore      Treadmill   MPH  0.5    Grade  0    Minutes  15      Recumbant Bike   Level  1    RPM  60    Minutes  15    METs  3      NuStep   Level  2    SPM  80    Minutes  15    METs  3      Arm Ergometer   Level  1    RPM  25    Minutes  15    METs  3      REL-XR   Level  2    Speed  50    Minutes  15    METs  3      T5 Nustep   Level  1    SPM  80    Minutes  15    METs  3      Biostep-RELP   Level  2    SPM  50    Minutes  15    METs  3      Prescription Details   Frequency (times per week)  3    Duration  Progress to 30 minutes of continuous aerobic without signs/symptoms of physical distress      Intensity   THRR 40-80% of Max Heartrate  106-162    Ratings of Perceived Exertion  11-13    Perceived Dyspnea  0-4      Resistance Training   Training Prescription  Yes    Weight  3 lb    Reps  10-15       Exercise Goals: Frequency: Be able to perform aerobic exercise two to three times per week in program working toward 2-5 days per week of home exercise.  Intensity: Work with a perceived exertion of 11 (fairly light) - 15 (hard) while following your exercise prescription.  We will make changes to your prescription with you as you progress through the program.   Duration: Be able to do 30 to 45 minutes of continuous aerobic exercise in addition to a 5 minute warm-up and a 5 minute  cool-down routine.   Nutrition Goals: Your personal nutrition goals will be established when you do your nutrition analysis with the dietician.  The following are general nutrition guidelines to follow: Cholesterol < 200mg /day Sodium < 1500mg /day Fiber: Men under 50 yrs - 38 grams per day  Personal Goals: Personal Goals and Risk Factors at Admission - 03/10/19 1706      Core Components/Risk Factors/Patient Goals on Admission    Weight Management  Yes;Obesity;Weight Loss    Intervention  Weight Management: Develop a combined nutrition and exercise program designed to reach desired caloric intake, while maintaining appropriate intake of nutrient and fiber, sodium and fats, and appropriate energy expenditure required for the weight  goal.;Weight Management: Provide education and appropriate resources to help participant work on and attain dietary goals.;Weight Management/Obesity: Establish reasonable short term and long term weight goals.;Obesity: Provide education and appropriate resources to help participant work on and attain dietary goals.    Admit Weight  199 lb 8 oz (90.5 kg)    Goal Weight: Short Term  195 lb (88.5 kg)    Goal Weight: Long Term  190 lb (86.2 kg)    Expected Outcomes  Short Term: Continue to assess and modify interventions until short term weight is achieved;Long Term: Adherence to nutrition and physical activity/exercise program aimed toward attainment of established weight goal;Weight Loss: Understanding of general recommendations for a balanced deficit meal plan, which promotes 1-2 lb weight loss per week and includes a negative energy balance of [phone] kcal/d;Understanding recommendations for meals to include 15-35% energy as protein, 25-35% energy from fat, 35-60% energy from carbohydrates, less than 200mg  of dietary cholesterol, 20-35 gm of total fiber daily;Understanding of distribution of calorie intake throughout the day with the consumption of 4-5 meals/snacks        Tobacco Use Initial Evaluation: Social History   Tobacco Use  Smoking Status Never Smoker  Smokeless Tobacco Never Used    Exercise Goals and Review: Exercise Goals    Row Name 03/10/19 1704             Exercise Goals   Increase Physical Activity  Yes       Intervention  Provide advice, education, support and counseling about physical activity/exercise needs.;Develop an individualized exercise prescription for aerobic and resistive training based on initial evaluation findings, risk stratification, comorbidities and participant's personal goals.       Expected Outcomes  Short Term: Attend rehab on a regular basis to increase amount of physical activity.;Long Term: Add in home exercise to make exercise part of routine and to increase amount of physical activity.;Long Term: Exercising regularly at least 3-5 days a week.       Increase Strength and Stamina  Yes       Intervention  Provide advice, education, support and counseling about physical activity/exercise needs.;Develop an individualized exercise prescription for aerobic and resistive training based on initial evaluation findings, risk stratification, comorbidities and participant's personal goals.       Expected Outcomes  Short Term: Increase workloads from initial exercise prescription for resistance, speed, and METs.;Short Term: Perform resistance training exercises routinely during rehab and add in resistance training at home;Long Term: Improve cardiorespiratory fitness, muscular endurance and strength as measured by increased METs and functional capacity (6MWT)       Able to understand and use rate of perceived exertion (RPE) scale  Yes       Intervention  Provide education and explanation on how to use RPE scale       Expected Outcomes  Short Term: Able to use RPE daily in rehab to express subjective intensity level;Long Term:  Able to use RPE to guide intensity level when exercising independently       Knowledge and  understanding of Target Heart Rate Range (THRR)  Yes       Intervention  Provide education and explanation of THRR including how the numbers were predicted and where they are located for reference       Expected Outcomes  Short Term: Able to state/look up THRR;Short Term: Able to use daily as guideline for intensity in rehab;Long Term: Able to use THRR to govern intensity when exercising independently  Understanding of Exercise Prescription  Yes       Intervention  Provide education, explanation, and written materials on patient's individual exercise prescription       Expected Outcomes  Short Term: Able to explain program exercise prescription;Long Term: Able to explain home exercise prescription to exercise independently          Copy of goals given to participant.

## 2019-03-10 NOTE — Progress Notes (Signed)
Cardiac Individual Treatment Plan  Patient Details  Name: Gary Davila MRN: [id number] Date of Birth: [birth year] Referring Provider:     Cardiac Rehab from 03/10/2019 in [facility] Cardiac and Pulmonary Rehab  Referring Provider  Devore      Initial Encounter Date:    Cardiac Rehab from 03/10/2019 in [facility] Cardiac and Pulmonary Rehab  Date  03/10/19      Visit Diagnosis: Heart failure, chronic systolic ([place])  Patient's Home Medications on Admission:  Current Outpatient Medications:  .  carvedilol (COREG) 25 MG tablet, TAKE (1) TABLET TWICE A DAY., Disp: , Rfl:  .  digoxin (LANOXIN) 0.125 MG tablet, TAKE 1 TABLET DAILY., Disp: , Rfl:  .  empagliflozin (JARDIANCE) 10 MG TABS tablet, Take by mouth., Disp: , Rfl:  .  eplerenone (INSPRA) 25 MG tablet, TAKE 1 TABLET DAILY., Disp: , Rfl:  .  furosemide (LASIX) 20 MG tablet, Take 40 mg by mouth 2 (two) times daily., Disp: , Rfl:  .  ibuprofen (ADVIL,MOTRIN) 200 MG tablet, Take by mouth., Disp: , Rfl:  .  sacubitril-valsartan (ENTRESTO) 24-26 MG, Take by mouth., Disp: , Rfl:  .  warfarin (COUMADIN) 5 MG tablet, Take 7.6m (one and half pill of 572m on Tues / Thurs, then 92m70mvery other day., Disp: , Rfl:   Past Medical History: Past Medical History:  Diagnosis Date  . A-fib ([place] . Chronic CHF (congestive heart failure) ([facility] . Coarctation of aorta   . Dilated cardiomyopathy ([place] . Hypertension   . ICD (implantable cardioverter-defibrillator) in place   . ICD (implantable cardioverter-defibrillator) lead failure   . Infection of pacemaker pocket ([facility] . Left ventricular diastolic dysfunction, NYHA class 2   . Obesity   . Ventricular tachycardia (HCC)     Tobacco Use: Social History   Tobacco Use  Smoking Status Never Smoker  Smokeless Tobacco Never Used    Labs: Recent Review Flowsheet Data    There is no flowsheet data to display.       Exercise Target Goals: Exercise Program Goal: Individual exercise  prescription set using results from initial 6 min walk test and THRR while considering  patient's activity barriers and safety.   Exercise Prescription Goal: Initial exercise prescription builds to 30-45 minutes a day of aerobic activity, 2-3 days per week.  Home exercise guidelines will be given to patient during program as part of exercise prescription that the participant will acknowledge.  Activity Barriers & Risk Stratification: Activity Barriers & Cardiac Risk Stratification - 02/25/19 1512      Activity Barriers & Cardiac Risk Stratification   Activity Barriers  Deconditioning;Balance Concerns;Assistive Device;Muscular Weakness;Shortness of Breath;Other (comment);History of Falls    Comments  problems with knees and ankles from possible nueropathy    Cardiac Risk Stratification  High       6 Minute Walk: 6 Minute Walk    Row Name 03/10/19 1653         6 Minute Walk   Distance  430 feet     Walk Time  6 minutes     # of Rest Breaks  0     MPH  0.8     METS  3.04     RPE  11     Perceived Dyspnea   0     VO2 Peak  10.65     Symptoms  Yes (comment)     Comments  foot pain - plantar fascitis 3/10  Resting HR  50 bpm     Resting BP  94/52     Resting Oxygen Saturation   99 %     Exercise Oxygen Saturation  during 6 min walk  97 %     Max Ex. HR  84 bpm     Max Ex. BP  94/54        Oxygen Initial Assessment:   Oxygen Re-Evaluation:   Oxygen Discharge (Final Oxygen Re-Evaluation):   Initial Exercise Prescription: Initial Exercise Prescription - 03/10/19 1700      Date of Initial Exercise RX and Referring Provider   Date  03/10/19    Referring Provider  Devore      Treadmill   MPH  0.5    Grade  0    Minutes  15      Recumbant Bike   Level  1    RPM  60    Minutes  15    METs  3      NuStep   Level  2    SPM  80    Minutes  15    METs  3      Arm Ergometer   Level  1    RPM  25    Minutes  15    METs  3      REL-XR   Level  2     Speed  50    Minutes  15    METs  3      T5 Nustep   Level  1    SPM  80    Minutes  15    METs  3      Biostep-RELP   Level  2    SPM  50    Minutes  15    METs  3      Prescription Details   Frequency (times per week)  3    Duration  Progress to 30 minutes of continuous aerobic without signs/symptoms of physical distress      Intensity   THRR 40-80% of Max Heartrate  106-162    Ratings of Perceived Exertion  11-13    Perceived Dyspnea  0-4      Resistance Training   Training Prescription  Yes    Weight  3 lb    Reps  10-15       Perform Capillary Blood Glucose checks as needed.  Exercise Prescription Changes: Exercise Prescription Changes    Row Name 03/10/19 1700             Response to Exercise   Blood Pressure (Admit)  94/52       Blood Pressure (Exercise)  94/54       Heart Rate (Admit)  50 bpm       Heart Rate (Exercise)  84 bpm       Heart Rate (Exit)  72 bpm       Oxygen Saturation (Admit)  99 %       Oxygen Saturation (Exercise)  97 %       Rating of Perceived Exertion (Exercise)  11       Perceived Dyspnea (Exercise)  0       Symptoms  leg/foot  pain  plantar fascitis          Exercise Comments:   Exercise Goals and Review: Exercise Goals    Row Name 03/10/19 1704  Exercise Goals   Increase Physical Activity  Yes       Intervention  Provide advice, education, support and counseling about physical activity/exercise needs.;Develop an individualized exercise prescription for aerobic and resistive training based on initial evaluation findings, risk stratification, comorbidities and participant's personal goals.       Expected Outcomes  Short Term: Attend rehab on a regular basis to increase amount of physical activity.;Long Term: Add in home exercise to make exercise part of routine and to increase amount of physical activity.;Long Term: Exercising regularly at least 3-5 days a week.       Increase Strength and Stamina  Yes        Intervention  Provide advice, education, support and counseling about physical activity/exercise needs.;Develop an individualized exercise prescription for aerobic and resistive training based on initial evaluation findings, risk stratification, comorbidities and participant's personal goals.       Expected Outcomes  Short Term: Increase workloads from initial exercise prescription for resistance, speed, and METs.;Short Term: Perform resistance training exercises routinely during rehab and add in resistance training at home;Long Term: Improve cardiorespiratory fitness, muscular endurance and strength as measured by increased METs and functional capacity (6MWT)       Able to understand and use rate of perceived exertion (RPE) scale  Yes       Intervention  Provide education and explanation on how to use RPE scale       Expected Outcomes  Short Term: Able to use RPE daily in rehab to express subjective intensity level;Long Term:  Able to use RPE to guide intensity level when exercising independently       Knowledge and understanding of Target Heart Rate Range (THRR)  Yes       Intervention  Provide education and explanation of THRR including how the numbers were predicted and where they are located for reference       Expected Outcomes  Short Term: Able to state/look up THRR;Short Term: Able to use daily as guideline for intensity in rehab;Long Term: Able to use THRR to govern intensity when exercising independently       Understanding of Exercise Prescription  Yes       Intervention  Provide education, explanation, and written materials on patient's individual exercise prescription       Expected Outcomes  Short Term: Able to explain program exercise prescription;Long Term: Able to explain home exercise prescription to exercise independently          Exercise Goals Re-Evaluation :   Discharge Exercise Prescription (Final Exercise Prescription Changes): Exercise Prescription Changes - 03/10/19  1700      Response to Exercise   Blood Pressure (Admit)  94/52    Blood Pressure (Exercise)  94/54    Heart Rate (Admit)  50 bpm    Heart Rate (Exercise)  84 bpm    Heart Rate (Exit)  72 bpm    Oxygen Saturation (Admit)  99 %    Oxygen Saturation (Exercise)  97 %    Rating of Perceived Exertion (Exercise)  11    Perceived Dyspnea (Exercise)  0    Symptoms  leg/foot  pain    plantar fascitis      Nutrition:  Target Goals: Understanding of nutrition guidelines, daily intake of sodium <1576m, cholesterol <2067m calories 30% from fat and 7% or less from saturated fats, daily to have 5 or more servings of fruits and vegetables.  Biometrics:    Nutrition Therapy Plan and Nutrition Goals:  Nutrition Assessments:   Nutrition Goals Re-Evaluation:   Nutrition Goals Discharge (Final Nutrition Goals Re-Evaluation):   Psychosocial: Target Goals: Acknowledge presence or absence of significant depression and/or stress, maximize coping skills, provide positive support system. Participant is able to verbalize types and ability to use techniques and skills needed for reducing stress and depression.   Initial Review & Psychosocial Screening: Initial Psych Review & Screening - 02/25/19 1514      Initial Review   Current issues with  Current Stress Concerns    Source of Stress Concerns  Chronic Illness    Comments  Been dealing with heart issues since birth, first noted in 2005      [place]?  Yes   mom, aunt   Concerns  Recent loss of significant other    Comments  Recently lost father in August      Barriers   Psychosocial barriers to participate in program  The patient should benefit from training in stress management and relaxation.;Psychosocial barriers identified (see note)      Screening Interventions   Interventions  Encouraged to exercise;To provide support and resources with identified psychosocial needs;Provide feedback about the scores  to participant    Expected Outcomes  Short Term goal: Utilizing psychosocial counselor, staff and physician to assist with identification of specific Stressors or current issues interfering with healing process. Setting desired goal for each stressor or current issue identified.;Long Term Goal: Stressors or current issues are controlled or eliminated.;Short Term goal: Identification and review with participant of any Quality of Life or Depression concerns found by scoring the questionnaire.;Long Term goal: The participant improves quality of Life and PHQ9 Scores as seen by post scores and/or verbalization of changes       Quality of Life Scores:  Quality of Life - 03/10/19 1706      Quality of Life   Select  Quality of Life      Quality of Life Scores   Health/Function Pre  20.43 %    Socioeconomic Pre  23.29 %    Psych/Spiritual Pre  21.83 %    Family Pre  26.67 %    GLOBAL Pre  21.95 %      Scores of 19 and below usually indicate a poorer quality of life in these areas.  A difference of  2-3 points is a clinically meaningful difference.  A difference of 2-3 points in the total score of the Quality of Life Index has been associated with significant improvement in overall quality of life, self-image, physical symptoms, and general health in studies assessing change in quality of life.  PHQ-9: Recent Review Flowsheet Data    Depression screen [facility] 2/9 03/10/2019 03/04/2019   Decreased Interest 0 0   Down, Depressed, Hopeless 0 0   PHQ - 2 Score 0 0   Altered sleeping 0 -   Tired, decreased energy 1 -   Change in appetite 0 -   Feeling bad or failure about yourself  0 -   Trouble concentrating 0 -   Moving slowly or fidgety/restless 3 -   Suicidal thoughts 0 -   PHQ-9 Score 4 -   Difficult doing work/chores Somewhat difficult -     Interpretation of Total Score  Total Score Depression Severity:  1-4 = Minimal depression, 5-9 = Mild depression, 10-14 = Moderate depression, 15-19 =  Moderately severe depression, 20-27 = Severe depression   Psychosocial Evaluation and Intervention:   Psychosocial Re-Evaluation:  Psychosocial Discharge (Final Psychosocial Re-Evaluation):   Vocational Rehabilitation: Provide vocational rehab assistance to qualifying candidates.   Vocational Rehab Evaluation & Intervention: Vocational Rehab - 02/25/19 1516      Initial Vocational Rehab Evaluation & Intervention   Assessment shows need for Vocational Rehabilitation  No       Education: Education Goals: Education classes will be provided on a variety of topics geared toward better understanding of heart health and risk factor modification. Participant will state understanding/return demonstration of topics presented as noted by education test scores.  Learning Barriers/Preferences: Learning Barriers/Preferences - 02/25/19 1513      Learning Barriers/Preferences   Learning Barriers  None    Learning Preferences  Pictoral       Education Topics:  AED/CPR: - Group verbal and written instruction with the use of models to demonstrate the basic use of the AED with the basic ABC's of resuscitation.   General Nutrition Guidelines/Fats and Fiber: -Group instruction provided by verbal, written material, models and posters to present the general guidelines for heart healthy nutrition. Gives an explanation and review of dietary fats and fiber.   Controlling Sodium/Reading Food Labels: -Group verbal and written material supporting the discussion of sodium use in heart healthy nutrition. Review and explanation with models, verbal and written materials for utilization of the food label.   Exercise Physiology & General Exercise Guidelines: - Group verbal and written instruction with models to review the exercise physiology of the cardiovascular system and associated critical values. Provides general exercise guidelines with specific guidelines to those with heart or lung disease.     Aerobic Exercise & Resistance Training: - Gives group verbal and written instruction on the various components of exercise. Focuses on aerobic and resistive training programs and the benefits of this training and how to safely progress through these programs..   Flexibility, Balance, Mind/Body Relaxation: Provides group verbal/written instruction on the benefits of flexibility and balance training, including mind/body exercise modes such as yoga, pilates and tai chi.  Demonstration and skill practice provided.   Stress and Anxiety: - Provides group verbal and written instruction about the health risks of elevated stress and causes of high stress.  Discuss the correlation between heart/lung disease and anxiety and treatment options. Review healthy ways to manage with stress and anxiety.   Depression: - Provides group verbal and written instruction on the correlation between heart/lung disease and depressed mood, treatment options, and the stigmas associated with seeking treatment.   Anatomy & Physiology of the Heart: - Group verbal and written instruction and models provide basic cardiac anatomy and physiology, with the coronary electrical and arterial systems. Review of Valvular disease and Heart Failure   Cardiac Procedures: - Group verbal and written instruction to review commonly prescribed medications for heart disease. Reviews the medication, class of the drug, and side effects. Includes the steps to properly store meds and maintain the prescription regimen. (beta blockers and nitrates)   Cardiac Medications I: - Group verbal and written instruction to review commonly prescribed medications for heart disease. Reviews the medication, class of the drug, and side effects. Includes the steps to properly store meds and maintain the prescription regimen.   Cardiac Medications II: -Group verbal and written instruction to review commonly prescribed medications for heart disease.  Reviews the medication, class of the drug, and side effects. (all other drug classes)    Go Sex-Intimacy & Heart Disease, Get SMART - Goal Setting: - Group verbal and written instruction through game format to discuss  heart disease and the return to sexual intimacy. Provides group verbal and written material to discuss and apply goal setting through the application of the S.M.A.R.T. Method.   Other Matters of the Heart: - Provides group verbal, written materials and models to describe Stable Angina and Peripheral Artery. Includes description of the disease process and treatment options available to the cardiac patient.   Exercise & Equipment Safety: - Individual verbal instruction and demonstration of equipment use and safety with use of the equipment.   Cardiac Rehab from 03/10/2019 in [facility] Cardiac and Pulmonary Rehab  Date  03/10/19  Educator  AS  Instruction Review Code  1- Verbalizes Understanding      Infection Prevention: - Provides verbal and written material to individual with discussion of infection control including proper hand washing and proper equipment cleaning during exercise session.   Cardiac Rehab from 03/10/2019 in [facility] Cardiac and Pulmonary Rehab  Date  03/10/19  Educator  AS  Instruction Review Code  1- Verbalizes Understanding      Falls Prevention: - Provides verbal and written material to individual with discussion of falls prevention and safety.   Cardiac Rehab from 03/10/2019 in [facility] Cardiac and Pulmonary Rehab  Date  03/10/19  Educator  AS  Instruction Review Code  1- Verbalizes Understanding      Diabetes: - Individual verbal and written instruction to review signs/symptoms of diabetes, desired ranges of glucose level fasting, after meals and with exercise. Acknowledge that pre and post exercise glucose checks will be done for 3 sessions at entry of program.   Know Your Numbers and Risk Factors: -Group verbal and written instruction about  important numbers in your health.  Discussion of what are risk factors and how they play a role in the disease process.  Review of Cholesterol, Blood Pressure, Diabetes, and BMI and the role they play in your overall health.   Sleep Hygiene: -Provides group verbal and written instruction about how sleep can affect your health.  Define sleep hygiene, discuss sleep cycles and impact of sleep habits. Review good sleep hygiene tips.    Other: -Provides group and verbal instruction on various topics (see comments)   Knowledge Questionnaire Score:   Core Components/Risk Factors/Patient Goals at Admission: Personal Goals and Risk Factors at Admission - 03/10/19 1706      Core Components/Risk Factors/Patient Goals on Admission    Weight Management  Yes;Obesity;Weight Loss    Intervention  Weight Management: Develop a combined nutrition and exercise program designed to reach desired caloric intake, while maintaining appropriate intake of nutrient and fiber, sodium and fats, and appropriate energy expenditure required for the weight goal.;Weight Management: Provide education and appropriate resources to help participant work on and attain dietary goals.;Weight Management/Obesity: Establish reasonable short term and long term weight goals.;Obesity: Provide education and appropriate resources to help participant work on and attain dietary goals.    Admit Weight  199 lb 8 oz (90.5 kg)    Goal Weight: Short Term  195 lb (88.5 kg)    Goal Weight: Long Term  190 lb (86.2 kg)    Expected Outcomes  Short Term: Continue to assess and modify interventions until short term weight is achieved;Long Term: Adherence to nutrition and physical activity/exercise program aimed toward attainment of established weight goal;Weight Loss: Understanding of general recommendations for a balanced deficit meal plan, which promotes 1-2 lb weight loss per week and includes a negative energy balance of [phone] kcal/d;Understanding  recommendations for meals to include 15-35% energy as  protein, 25-35% energy from fat, 35-60% energy from carbohydrates, less than 21m of dietary cholesterol, 20-35 gm of total fiber daily;Understanding of distribution of calorie intake throughout the day with the consumption of 4-5 meals/snacks       Core Components/Risk Factors/Patient Goals Review:    Core Components/Risk Factors/Patient Goals at Discharge (Final Review):    ITP Comments: ITP Comments    Row Name 02/25/19 1531           ITP Comments  Virtual Orientation Completed.  Documentation for diagnosis can be found in [facility]counter 02/11/19.  EP eval scheduled for 11/17 at 330pm          Comments: initial ITP

## 2019-03-12 ENCOUNTER — Encounter: Payer: Self-pay | Admitting: Podiatry

## 2019-03-12 ENCOUNTER — Ambulatory Visit (INDEPENDENT_AMBULATORY_CARE_PROVIDER_SITE_OTHER): Payer: Medicaid Other

## 2019-03-12 ENCOUNTER — Ambulatory Visit: Payer: Medicaid Other

## 2019-03-12 ENCOUNTER — Other Ambulatory Visit: Payer: Self-pay

## 2019-03-12 ENCOUNTER — Ambulatory Visit (INDEPENDENT_AMBULATORY_CARE_PROVIDER_SITE_OTHER): Payer: Medicaid Other | Admitting: Podiatry

## 2019-03-12 DIAGNOSIS — M79672 Pain in left foot: Secondary | ICD-10-CM | POA: Diagnosis not present

## 2019-03-12 DIAGNOSIS — M722 Plantar fascial fibromatosis: Secondary | ICD-10-CM

## 2019-03-12 DIAGNOSIS — M79671 Pain in right foot: Secondary | ICD-10-CM

## 2019-03-16 ENCOUNTER — Encounter: Payer: Self-pay | Admitting: Podiatry

## 2019-03-16 ENCOUNTER — Encounter: Payer: Medicaid Other | Admitting: *Deleted

## 2019-03-16 ENCOUNTER — Other Ambulatory Visit: Payer: Self-pay

## 2019-03-16 DIAGNOSIS — I5022 Chronic systolic (congestive) heart failure: Secondary | ICD-10-CM

## 2019-03-16 DIAGNOSIS — I110 Hypertensive heart disease with heart failure: Secondary | ICD-10-CM | POA: Diagnosis not present

## 2019-03-16 DIAGNOSIS — I420 Dilated cardiomyopathy: Secondary | ICD-10-CM

## 2019-03-16 NOTE — Progress Notes (Signed)
Daily Session Note  Patient Details  Name: Gary Davila MRN: [id number] Date of Birth: Jun 24, 1990 Referring Provider:     Cardiac Rehab from 03/10/2019 in [facility] Cardiac and Pulmonary Rehab  Referring Provider  Devore      Encounter Date: 03/16/2019  Check In: Session Check In - 03/16/19 1534      Check-In   Supervising physician immediately available to respond to emergencies  See telemetry face sheet for immediately available ER MD    Location  [facility]-Cardiac & Pulmonary Rehab    Staff Present  Heath Lark, RN, BSN, CCRP;Kyleigha Markert Sherryll Burger, RN BSN;Jeanna Durrell BS, Exercise Physiologist;Jessica Delshire, MA, RCEP, CCRP, CCET    Virtual Visit  No    Medication changes reported      No    Fall or balance concerns reported     No    Warm-up and Cool-down  Performed on first and last piece of equipment    Resistance Training Performed  Yes    VAD Patient?  No    PAD/SET Patient?  No      Pain Assessment   Currently in Pain?  No/denies          Social History   Tobacco Use  Smoking Status Never Smoker  Smokeless Tobacco Never Used    Goals Met:  Exercise tolerated well Personal goals reviewed No report of cardiac concerns or symptoms  Goals Unmet:  Not Applicable  Comments: First full day of exercise!  Patient was oriented to gym and equipment including functions, settings, policies, and procedures.  Patient's individual exercise prescription and treatment plan were reviewed.  All starting workloads were established based on the results of the 6 minute walk test done at initial orientation visit.  The plan for exercise progression was also introduced and progression will be customized based on patient's performance and goals.    Dr. Emily Filbert is Medical Director for [facility] and [facility].

## 2019-03-16 NOTE — Progress Notes (Addendum)
Subjective:  Patient ID: Gary Davila, male    DOB: 04/06/1991,  MRN: [id number]  Chief Complaint  Patient presents with  . Foot Pain    patient c/o bilat heel pain x years off and on, sometimes radiates in back of heel and up leg    28 y.o. male presents with the above complaint.  Patient presents with bilateral heel pain it has been going on for multiple years is on and off.  Patient states the left is worse than right.  Patient states the pain is right in the back of the heel is burning pains with shooting/stabbing.  Patient has tried various conservative therapy including stretching ice ibuprofen Tylenol.  Patient states that nothing has really helped with this.  He denies any other acute complaints.   Review of Systems: Negative except as noted in the HPI. Denies N/V/F/Ch.  Past Medical History:  Diagnosis Date  . A-fib (HCC)   . Chronic CHF (congestive heart failure) (HCC)   . Coarctation of aorta   . Dilated cardiomyopathy (HCC)   . Hypertension   . ICD (implantable cardioverter-defibrillator) in place   . ICD (implantable cardioverter-defibrillator) lead failure   . Infection of pacemaker pocket (HCC)   . Left ventricular diastolic dysfunction, NYHA class 2   . Obesity   . Ventricular tachycardia (HCC)     Current Outpatient Medications:  .  carvedilol (COREG) 25 MG tablet, TAKE (1) TABLET TWICE A DAY., Disp: , Rfl:  .  digoxin (LANOXIN) 0.125 MG tablet, TAKE 1 TABLET DAILY., Disp: , Rfl:  .  empagliflozin (JARDIANCE) 10 MG TABS tablet, Take by mouth., Disp: , Rfl:  .  eplerenone (INSPRA) 25 MG tablet, TAKE 1 TABLET DAILY., Disp: , Rfl:  .  furosemide (LASIX) 20 MG tablet, Take 40 mg by mouth 2 (two) times daily., Disp: , Rfl:  .  ibuprofen (ADVIL,MOTRIN) 200 MG tablet, Take by mouth., Disp: , Rfl:  .  sacubitril-valsartan (ENTRESTO) 24-26 MG, Take by mouth., Disp: , Rfl:  .  warfarin (COUMADIN) 5 MG tablet, Take 7.5mg  (one and half pill of 5mg ) on Tues / Thurs, then  5mg  every other day., Disp: , Rfl:   Social History   Tobacco Use  Smoking Status Never Smoker  Smokeless Tobacco Never Used    No Known Allergies Objective:  There were no vitals filed for this visit. There is no height or weight on file to calculate BMI. Constitutional Well developed. Well nourished.  Vascular Dorsalis pedis pulses palpable bilaterally. Posterior tibial pulses palpable bilaterally. Capillary refill normal to all digits.  No cyanosis or clubbing noted. Pedal hair growth normal.  Neurologic Normal speech. Oriented to person, place, and time. Epicritic sensation to light touch grossly present bilaterally.  Dermatologic Nails well groomed and normal in appearance. No open wounds. No skin lesions.  Orthopedic: Normal joint ROM without pain or crepitus bilaterally. No visible deformities. Tender to palpation at the calcaneal tuber bilaterally. No pain with calcaneal squeeze bilaterally. Ankle ROM diminished range of motion bilaterally. Silfverskiold Test: negative bilaterally.   Radiographs: Taken and reviewed. No acute fractures or dislocations. No evidence of stress fracture.  Plantar heel spur absent. Posterior heel spur present.   Assessment:   1. Plantar fasciitis of left foot   2. Plantar fasciitis of right foot   3. Pain in right foot   4. Pain in left foot    Plan:  Patient was evaluated and treated and all questions answered.  Plantar Fasciitis, bilaterally -  XR reviewed as above.  - Educated on icing and stretching. Instructions given.  - Injection delivered to the plantar fascia as below. - DME: Plantar Fascial Brace x2 - Pharmacologic management: None. Educated on risks/benefits and proper taking of medication.  Pes Planus bilaterally -I explained to the patient the etiology and various treatment options available for pes planus deformity.  I believe patient will benefit from custom-made orthotics to help address the hindfoot motion and  arch support  Procedure: Injection Tendon/Ligament Location: Bilateral plantar fascia at the glabrous junction; medial approach. Skin Prep: alcohol Injectate: 0.5 cc 0.5% marcaine plain, 0.5 cc of 1% Lidocaine, 0.5 cc kenalog 10. Disposition: Patient tolerated procedure well. Injection site dressed with a band-aid.  No follow-ups on file.

## 2019-03-18 ENCOUNTER — Other Ambulatory Visit: Payer: Self-pay

## 2019-03-18 ENCOUNTER — Encounter: Payer: Medicaid Other | Admitting: *Deleted

## 2019-03-18 DIAGNOSIS — I110 Hypertensive heart disease with heart failure: Secondary | ICD-10-CM | POA: Diagnosis not present

## 2019-03-18 DIAGNOSIS — I5022 Chronic systolic (congestive) heart failure: Secondary | ICD-10-CM

## 2019-03-18 NOTE — Progress Notes (Signed)
Daily Session Note  Patient Details  Name: Gary Davila MRN: [id number] Date of Birth: [birth year] Referring Provider:     Cardiac Rehab from 03/10/2019 in [facility] Cardiac [facility] Pulmonary Rehab  Referring Provider  [place]      Encounter Date: 03/18/2019  Check In: Session Check In - 03/18/19 1538      Check-In   Supervising physician immediately available to respond to emergencies  See telemetry face sheet for immediately available ER MD    Location  [facility]-Cardiac & Pulmonary Rehab    Staff Present  Renita Papa, RN Vickki Hearing, BA, ACSM CEP, Exercise Physiologist;Melissa Caiola RDN, LDN    Virtual Visit  No    Medication changes reported      No    Fall or balance concerns reported     No    Warm-up and Cool-down  Performed on first and last piece of equipment    Resistance Training Performed  Yes    VAD Patient?  No    PAD/SET Patient?  No      Pain Assessment   Currently in Pain?  No/denies          Social History   Tobacco Use  Smoking Status Never Smoker  Smokeless Tobacco Never Used    Goals Met:  Independence with exercise equipment Exercise tolerated well No report of cardiac concerns or symptoms Strength training completed today  Goals Unmet:  Not Applicable  Comments: Pt able to follow exercise prescription today without complaint.  Will continue to monitor for progression.    Dr. Emily Filbert is Medical Director for [facility] and [facility].

## 2019-03-23 ENCOUNTER — Other Ambulatory Visit: Payer: Self-pay

## 2019-03-23 ENCOUNTER — Encounter: Payer: Medicaid Other | Admitting: *Deleted

## 2019-03-23 DIAGNOSIS — I110 Hypertensive heart disease with heart failure: Secondary | ICD-10-CM | POA: Diagnosis not present

## 2019-03-23 DIAGNOSIS — I420 Dilated cardiomyopathy: Secondary | ICD-10-CM

## 2019-03-23 DIAGNOSIS — I5022 Chronic systolic (congestive) heart failure: Secondary | ICD-10-CM

## 2019-03-23 NOTE — Progress Notes (Signed)
Daily Session Note  Patient Details  Name: Gary Davila MRN: [id number] Date of Birth: 05-15-1990 Referring Provider:     Cardiac Rehab from 03/10/2019 in [facility] Cardiac [facility] Pul[facility] Re[facility]  Referring Provider  [place]      Encounter Date: 03/23/2019  Check In: Session Check In - 03/23/19 1553      Check-In   Supervising physician immediately available to respond to emergencies  See telemetry face sheet for immediately available ER MD    Location  [facility]-Cardiac & Pulmonary Rehab    Staff Present  Renita Papa, RN BSN;Jessica [place], MA, RCEP, CCRP, [place], BS, ACSM CEP, Exercise Physiologist;Joseph Dunlo RCP,RRT,BSRT    Virtual Visit  No    Medication changes reported      No    Fall or balance concerns reported     No    Warm-up and Cool-down  Performed on first and last piece of equipment    Resistance Training Performed  Yes    VAD Patient?  No    PAD/SET Patient?  No      Pain Assessment   Currently in Pain?  No/denies          Social History   Tobacco Use  Smoking Status Never Smoker  Smokeless Tobacco Never Used    Goals Met:  Independence with exercise equipment Exercise tolerated well No report of cardiac concerns or symptoms Strength training completed today  Goals Unmet:  Not Applicable  Comments: Pt able to follow exercise prescription today without complaint.  Will continue to monitor for progression.    Dr. Emily Filbert is Medical Director for [facility] and [facility].

## 2019-03-25 ENCOUNTER — Encounter: Payer: Medicaid Other | Attending: Cardiology | Admitting: *Deleted

## 2019-03-25 ENCOUNTER — Other Ambulatory Visit: Payer: Self-pay

## 2019-03-25 ENCOUNTER — Ambulatory Visit (INDEPENDENT_AMBULATORY_CARE_PROVIDER_SITE_OTHER): Payer: Medicaid Other | Admitting: Orthotics

## 2019-03-25 ENCOUNTER — Encounter: Payer: Self-pay | Admitting: *Deleted

## 2019-03-25 DIAGNOSIS — I5022 Chronic systolic (congestive) heart failure: Secondary | ICD-10-CM

## 2019-03-25 DIAGNOSIS — Z95810 Presence of automatic (implantable) cardiac defibrillator: Secondary | ICD-10-CM | POA: Insufficient documentation

## 2019-03-25 DIAGNOSIS — M722 Plantar fascial fibromatosis: Secondary | ICD-10-CM

## 2019-03-25 DIAGNOSIS — Z79899 Other long term (current) drug therapy: Secondary | ICD-10-CM | POA: Diagnosis not present

## 2019-03-25 DIAGNOSIS — E669 Obesity, unspecified: Secondary | ICD-10-CM | POA: Insufficient documentation

## 2019-03-25 DIAGNOSIS — I110 Hypertensive heart disease with heart failure: Secondary | ICD-10-CM | POA: Diagnosis not present

## 2019-03-25 DIAGNOSIS — I4891 Unspecified atrial fibrillation: Secondary | ICD-10-CM | POA: Insufficient documentation

## 2019-03-25 DIAGNOSIS — I420 Dilated cardiomyopathy: Secondary | ICD-10-CM | POA: Diagnosis not present

## 2019-03-25 DIAGNOSIS — I472 Ventricular tachycardia: Secondary | ICD-10-CM | POA: Diagnosis not present

## 2019-03-25 DIAGNOSIS — Z7901 Long term (current) use of anticoagulants: Secondary | ICD-10-CM | POA: Diagnosis not present

## 2019-03-25 NOTE — Progress Notes (Signed)
Cardiac Individual Treatment Plan  Patient Details  Name: Gary Davila MRN: [id number] Date of Birth: 1991/02/27 Referring Provider:     Cardiac Rehab from 03/10/2019 in [facility] Cardiac and Pulmonary Rehab  Referring Provider  Devore      Initial Encounter Date:    Cardiac Rehab from 03/10/2019 in [facility] Cardiac and Pulmonary Rehab  Date  03/10/19      Visit Diagnosis: Heart failure, chronic systolic ([place])  Patient's Home Medications on Admission:  Current Outpatient Medications:  .  carvedilol (COREG) 25 MG tablet, TAKE (1) TABLET TWICE A DAY., Disp: , Rfl:  .  digoxin (LANOXIN) 0.125 MG tablet, TAKE 1 TABLET DAILY., Disp: , Rfl:  .  empagliflozin (JARDIANCE) 10 MG TABS tablet, Take by mouth., Disp: , Rfl:  .  eplerenone (INSPRA) 25 MG tablet, TAKE 1 TABLET DAILY., Disp: , Rfl:  .  furosemide (LASIX) 20 MG tablet, Take 40 mg by mouth 2 (two) times daily., Disp: , Rfl:  .  ibuprofen (ADVIL,MOTRIN) 200 MG tablet, Take by mouth., Disp: , Rfl:  .  sacubitril-valsartan (ENTRESTO) 24-26 MG, Take by mouth., Disp: , Rfl:  .  warfarin (COUMADIN) 5 MG tablet, Take 7.7m (one and half pill of 531m on Tues / Thurs, then 10m67mvery other day., Disp: , Rfl:   Past Medical History: Past Medical History:  Diagnosis Date  . A-fib ([place] . Chronic CHF (congestive heart failure) ([place] . Coarctation of aorta   . Dilated cardiomyopathy ([place] . Hypertension   . ICD (implantable cardioverter-defibrillator) in place   . ICD (implantable cardioverter-defibrillator) lead failure   . 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 Left ventricular diastolic dysfunction, NYHA class 2   . Obesity   . Ventricular tachycardia (HCC)     Tobacco Use: Social History   Tobacco Use  Smoking Status Never Smoker  Smokeless Tobacco Never Used    Labs: Recent Review Flowsheet Data    There is no flowsheet data to display.       Exercise Target Goals: Exercise Program Goal: Individual exercise  prescription set using results from initial 6 min walk test and THRR while considering  patient's activity barriers and safety.   Exercise Prescription Goal: Initial exercise prescription builds to 30-45 minutes a day of aerobic activity, 2-3 days per week.  Home exercise guidelines will be given to patient during program as part of exercise prescription that the participant will acknowledge.  Activity Barriers & Risk Stratification: Activity Barriers & Cardiac Risk Stratification - 02/25/19 1512      Activity Barriers & Cardiac Risk Stratification   Activity Barriers  Deconditioning;Balance Concerns;Assistive Device;Muscular Weakness;Shortness of Breath;Other (comment);History of Falls    Comments  problems with knees and ankles from possible nueropathy    Cardiac Risk Stratification  High       6 Minute Walk: 6 Minute Walk    Row Name 03/10/19 1653         6 Minute Walk   Distance  430 feet     Walk Time  6 minutes     # of Rest Breaks  0     MPH  0.8     METS  3.04     RPE  11     Perceived Dyspnea   0     VO2 Peak  10.65     Symptoms  Yes (comment)     Comments  foot pain - plantar fascitis 3/10  Resting HR  50 bpm     Resting BP  94/52     Resting Oxygen Saturation   99 %     Exercise Oxygen Saturation  during 6 min walk  97 %     Max Ex. HR  84 bpm     Max Ex. BP  94/54        Oxygen Initial Assessment:   Oxygen Re-Evaluation:   Oxygen Discharge (Final Oxygen Re-Evaluation):   Initial Exercise Prescription: Initial Exercise Prescription - 03/10/19 1700      Date of Initial Exercise RX and Referring Provider   Date  03/10/19    Referring Provider  Devore      Treadmill   MPH  0.5    Grade  0    Minutes  15      Recumbant Bike   Level  1    RPM  60    Minutes  15    METs  3      NuStep   Level  2    SPM  80    Minutes  15    METs  3      Arm Ergometer   Level  1    RPM  25    Minutes  15    METs  3      REL-XR   Level  2     Speed  50    Minutes  15    METs  3      T5 Nustep   Level  1    SPM  80    Minutes  15    METs  3      Biostep-RELP   Level  2    SPM  50    Minutes  15    METs  3      Prescription Details   Frequency (times per week)  3    Duration  Progress to 30 minutes of continuous aerobic without signs/symptoms of physical distress      Intensity   THRR 40-80% of Max Heartrate  106-162    Ratings of Perceived Exertion  11-13    Perceived Dyspnea  0-4      Resistance Training   Training Prescription  Yes    Weight  3 lb    Reps  10-15       Perform Capillary Blood Glucose checks as needed.  Exercise Prescription Changes: Exercise Prescription Changes    Row Name 03/10/19 1700             Response to Exercise   Blood Pressure (Admit)  94/52       Blood Pressure (Exercise)  94/54       Heart Rate (Admit)  50 bpm       Heart Rate (Exercise)  84 bpm       Heart Rate (Exit)  72 bpm       Oxygen Saturation (Admit)  99 %       Oxygen Saturation (Exercise)  97 %       Rating of Perceived Exertion (Exercise)  11       Perceived Dyspnea (Exercise)  0       Symptoms  leg/foot  pain  plantar fascitis          Exercise Comments: Exercise Comments    Row Name 03/16/19 1536           Exercise Comments  First full day of  exercise!  Patient was oriented to gym and equipment including functions, settings, policies, and procedures.  Patient's individual exercise prescription and treatment plan were reviewed.  All starting workloads were established based on the results of the 6 minute walk test done at initial orientation visit.  The plan for exercise progression was also introduced and progression will be customized based on patient's performance and goals.          Exercise Goals and Review: Exercise Goals    Row Name 03/10/19 1704             Exercise Goals   Increase Physical Activity  Yes       Intervention  Provide advice, education, support and counseling about  physical activity/exercise needs.;Develop an individualized exercise prescription for aerobic and resistive training based on initial evaluation findings, risk stratification, comorbidities and participant's personal goals.       Expected Outcomes  Short Term: Attend rehab on a regular basis to increase amount of physical activity.;Long Term: Add in home exercise to make exercise part of routine and to increase amount of physical activity.;Long Term: Exercising regularly at least 3-5 days a week.       Increase Strength and Stamina  Yes       Intervention  Provide advice, education, support and counseling about physical activity/exercise needs.;Develop an individualized exercise prescription for aerobic and resistive training based on initial evaluation findings, risk stratification, comorbidities and participant's personal goals.       Expected Outcomes  Short Term: Increase workloads from initial exercise prescription for resistance, speed, and METs.;Short Term: Perform resistance training exercises routinely during rehab and add in resistance training at home;Long Term: Improve cardiorespiratory fitness, muscular endurance and strength as measured by increased METs and functional capacity (6MWT)       Able to understand and use rate of perceived exertion (RPE) scale  Yes       Intervention  Provide education and explanation on how to use RPE scale       Expected Outcomes  Short Term: Able to use RPE daily in rehab to express subjective intensity level;Long Term:  Able to use RPE to guide intensity level when exercising independently       Knowledge and understanding of Target Heart Rate Range (THRR)  Yes       Intervention  Provide education and explanation of THRR including how the numbers were predicted and where they are located for reference       Expected Outcomes  Short Term: Able to state/look up THRR;Short Term: Able to use daily as guideline for intensity in rehab;Long Term: Able to use THRR to  govern intensity when exercising independently       Understanding of Exercise Prescription  Yes       Intervention  Provide education, explanation, and written materials on patient's individual exercise prescription       Expected Outcomes  Short Term: Able to explain program exercise prescription;Long Term: Able to explain home exercise prescription to exercise independently          Exercise Goals Re-Evaluation : Exercise Goals Re-Evaluation    Row Name 03/16/19 1537             Exercise Goal Re-Evaluation   Exercise Goals Review  Increase Physical Activity;Knowledge and understanding of Target Heart Rate Range (THRR);Able to check pulse independently;Understanding of Exercise Prescription;Able to understand and use rate of perceived exertion (RPE) scale       Comments  Reviewed RPE  scale, THR and program prescription with pt today.  Pt voiced understanding and was given a copy of goals to take home.       Expected Outcomes  Short: Use RPE daily to regulate intensity. Long: Follow program prescription in THR.          Discharge Exercise Prescription (Final Exercise Prescription Changes): Exercise Prescription Changes - 03/10/19 1700      Response to Exercise   Blood Pressure (Admit)  94/52    Blood Pressure (Exercise)  94/54    Heart Rate (Admit)  50 bpm    Heart Rate (Exercise)  84 bpm    Heart Rate (Exit)  72 bpm    Oxygen Saturation (Admit)  99 %    Oxygen Saturation (Exercise)  97 %    Rating of Perceived Exertion (Exercise)  11    Perceived Dyspnea (Exercise)  0    Symptoms  leg/foot  pain    plantar fascitis      Nutrition:  Target Goals: Understanding of nutrition guidelines, daily intake of sodium <1545m, cholesterol <2013m calories 30% from fat and 7% or less from saturated fats, daily to have 5 or more servings of fruits and vegetables.  Biometrics:    Nutrition Therapy Plan and Nutrition Goals:   Nutrition Assessments:   Nutrition Goals  Re-Evaluation:   Nutrition Goals Discharge (Final Nutrition Goals Re-Evaluation):   Psychosocial: Target Goals: Acknowledge presence or absence of significant depression and/or stress, maximize coping skills, provide positive support system. Participant is able to verbalize types and ability to use techniques and skills needed for reducing stress and depression.   Initial Review & Psychosocial Screening: Initial Psych Review & Screening - 02/25/19 1514      Initial Review   Current issues with  Current Stress Concerns    Source of Stress Concerns  Chronic Illness    Comments  Been dealing with heart issues since birth, first noted in 2005      [place]   mom, aunt   Concerns  Recent loss of significant other    Comments  Recently lost father in August      Barriers   Psychosocial barriers to participate in program  The patient should benefit from training in stress management and relaxation.;Psychosocial barriers identified (see note)      Screening Interventions   Interventions  Encouraged to exercise;To provide support and resources with identified psychosocial needs;Provide feedback about the scores to participant    Expected Outcomes  Short Term goal: Utilizing psychosocial counselor, staff and physician to assist with identification of specific Stressors or current issues interfering with healing process. Setting desired goal for each stressor or current issue identified.;Long Term Goal: Stressors or current issues are controlled or eliminated.;Short Term goal: Identification and review with participant of any Quality of Life or Depression concerns found by scoring the questionnaire.;Long Term goal: The participant improves quality of Life and PHQ9 Scores as seen by post scores and/or verbalization of changes       Quality of Life Scores:  Quality of Life - 03/10/19 1706      Quality of Life   Select  Quality of Life      Quality of Life  Scores   Health/Function Pre  20.43 %    Socioeconomic Pre  23.29 %    Psych/Spiritual Pre  21.83 %    Family Pre  26.67 %    GLOBAL Pre  21.95 %  Scores of 19 and below usually indicate a poorer quality of life in these areas.  A difference of  2-3 points is a clinically meaningful difference.  A difference of 2-3 points in the total score of the Quality of Life Index has been associated with significant improvement in overall quality of life, self-image, physical symptoms, and general health in studies assessing change in quality of life.  PHQ-9: Recent Review Flowsheet Data    Depression screen [facility] 2/9 03/10/2019 03/04/2019   Decreased Interest 0 0   Down, Depressed, Hopeless 0 0   PHQ - 2 Score 0 0   Altered sleeping 0 -   Tired, decreased energy 1 -   Change in appetite 0 -   Feeling bad or failure about yourself  0 -   Trouble concentrating 0 -   Moving slowly or fidgety/restless 3 -   Suicidal thoughts 0 -   PHQ-9 Score 4 -   Difficult doing work/chores Somewhat difficult -     Interpretation of Total Score  Total Score Depression Severity:  1-4 = Minimal depression, 5-9 = Mild depression, 10-14 = Moderate depression, 15-19 = Moderately severe depression, 20-27 = Severe depression   Psychosocial Evaluation and Intervention:   Psychosocial Re-Evaluation:   Psychosocial Discharge (Final Psychosocial Re-Evaluation):   Vocational Rehabilitation: Provide vocational rehab assistance to qualifying candidates.   Vocational Rehab Evaluation & Intervention: Vocational Rehab - 02/25/19 1516      Initial Vocational Rehab Evaluation & Intervention   Assessment shows need for Vocational Rehabilitation  No       Education: Education Goals: Education classes will be provided on a variety of topics geared toward better understanding of heart health and risk factor modification. Participant will state understanding/return demonstration of topics presented as noted by  education test scores.  Learning Barriers/Preferences: Learning Barriers/Preferences - 02/25/19 1513      Learning Barriers/Preferences   Learning Barriers  None    Learning Preferences  Pictoral       Education Topics:  AED/CPR: - Group verbal and written instruction with the use of models to demonstrate the basic use of the AED with the basic ABC's of resuscitation.   General Nutrition Guidelines/Fats and Fiber: -Group instruction provided by verbal, written material, models and posters to present the general guidelines for heart healthy nutrition. Gives an explanation and review of dietary fats and fiber.   Controlling Sodium/Reading Food Labels: -Group verbal and written material supporting the discussion of sodium use in heart healthy nutrition. Review and explanation with models, verbal and written materials for utilization of the food label.   Exercise Physiology & General Exercise Guidelines: - Group verbal and written instruction with models to review the exercise physiology of the cardiovascular system and associated critical values. Provides general exercise guidelines with specific guidelines to those with heart or lung disease.    Aerobic Exercise & Resistance Training: - Gives group verbal and written instruction on the various components of exercise. Focuses on aerobic and resistive training programs and the benefits of this training and how to safely progress through these programs..   Flexibility, Balance, Mind/Body Relaxation: Provides group verbal/written instruction on the benefits of flexibility and balance training, including mind/body exercise modes such as yoga, pilates and tai chi.  Demonstration and skill practice provided.   Stress and Anxiety: - Provides group verbal and written instruction about the health risks of elevated stress and causes of high stress.  Discuss the correlation between heart/lung disease and anxiety and treatment  options. Review  healthy ways to manage with stress and anxiety.   Depression: - Provides group verbal and written instruction on the correlation between heart/lung disease and depressed mood, treatment options, and the stigmas associated with seeking treatment.   Anatomy & Physiology of the Heart: - Group verbal and written instruction and models provide basic cardiac anatomy and physiology, with the coronary electrical and arterial systems. Review of Valvular disease and Heart Failure   Cardiac Procedures: - Group verbal and written instruction to review commonly prescribed medications for heart disease. Reviews the medication, class of the drug, and side effects. Includes the steps to properly store meds and maintain the prescription regimen. (beta blockers and nitrates)   Cardiac Medications I: - Group verbal and written instruction to review commonly prescribed medications for heart disease. Reviews the medication, class of the drug, and side effects. Includes the steps to properly store meds and maintain the prescription regimen.   Cardiac Medications II: -Group verbal and written instruction to review commonly prescribed medications for heart disease. Reviews the medication, class of the drug, and side effects. (all other drug classes)    Go Sex-Intimacy & Heart Disease, Get SMART - Goal Setting: - Group verbal and written instruction through game format to discuss heart disease and the return to sexual intimacy. Provides group verbal and written material to discuss and apply goal setting through the application of the S.M.A.R.T. Method.   Other Matters of the Heart: - Provides group verbal, written materials and models to describe Stable Angina and Peripheral Artery. Includes description of the disease process and treatment options available to the cardiac patient.   Exercise & Equipment Safety: - Individual verbal instruction and demonstration of equipment use and safety with use of the  equipment.   Cardiac Rehab from 03/10/2019 in [facility] Cardiac and Pulmonary Rehab  Date  03/10/19  Educator  AS  Instruction Review Code  1- Verbalizes Understanding      Infection Prevention: - Provides verbal and written material to individual with discussion of infection control including proper hand washing and proper equipment cleaning during exercise session.   Cardiac Rehab from 03/10/2019 in [facility] Cardiac and Pulmonary Rehab  Date  03/10/19  Educator  AS  Instruction Review Code  1- Verbalizes Understanding      Falls Prevention: - Provides verbal and written material to individual with discussion of falls prevention and safety.   Cardiac Rehab from 03/10/2019 in [facility] Cardiac and Pulmonary Rehab  Date  03/10/19  Educator  AS  Instruction Review Code  1- Verbalizes Understanding      Diabetes: - Individual verbal and written instruction to review signs/symptoms of diabetes, desired ranges of glucose level fasting, after meals and with exercise. Acknowledge that pre and post exercise glucose checks will be done for 3 sessions at entry of program.   Know Your Numbers and Risk Factors: -Group verbal and written instruction about important numbers in your health.  Discussion of what are risk factors and how they play a role in the disease process.  Review of Cholesterol, Blood Pressure, Diabetes, and BMI and the role they play in your overall health.   Sleep Hygiene: -Provides group verbal and written instruction about how sleep can affect your health.  Define sleep hygiene, discuss sleep cycles and impact of sleep habits. Review good sleep hygiene tips.    Other: -Provides group and verbal instruction on various topics (see comments)   Knowledge Questionnaire Score:   Core Components/Risk Factors/Patient Goals at Admission:  Personal Goals and Risk Factors at Admission - 03/10/19 1706      Core Components/Risk Factors/Patient Goals on Admission    Weight Management   Yes;Obesity;Weight Loss    Intervention  Weight Management: Develop a combined nutrition and exercise program designed to reach desired caloric intake, while maintaining appropriate intake of nutrient and fiber, sodium and fats, and appropriate energy expenditure required for the weight goal.;Weight Management: Provide education and appropriate resources to help participant work on and attain dietary goals.;Weight Management/Obesity: Establish reasonable short term and long term weight goals.;Obesity: Provide education and appropriate resources to help participant work on and attain dietary goals.    Admit Weight  199 lb 8 oz (90.5 kg)    Goal Weight: Short Term  195 lb (88.5 kg)    Goal Weight: Long Term  190 lb (86.2 kg)    Expected Outcomes  Short Term: Continue to assess and modify interventions until short term weight is achieved;Long Term: Adherence to nutrition and physical activity/exercise program aimed toward attainment of established weight goal;Weight Loss: Understanding of general recommendations for a balanced deficit meal plan, which promotes 1-2 lb weight loss per week and includes a negative energy balance of [phone] kcal/d;Understanding recommendations for meals to include 15-35% energy as protein, 25-35% energy from fat, 35-60% energy from carbohydrates, less than 274m of dietary cholesterol, 20-35 gm of total fiber daily;Understanding of distribution of calorie intake throughout the day with the consumption of 4-5 meals/snacks       Core Components/Risk Factors/Patient Goals Review:    Core Components/Risk Factors/Patient Goals at Discharge (Final Review):    ITP Comments: ITP Comments    Row Name 02/25/19 1531 03/16/19 1536 03/16/19 1551 03/25/19 1059     ITP Comments  Virtual Orientation Completed.  [place]  EP eval scheduled for 11/17 at 330pm  First full day of exercise!  Patient was oriented to gym  and equipment including functions, settings, policies, and procedures.  Patient's individual exercise prescription and treatment plan were reviewed.  All starting workloads were established based on the results of the 6 minute walk test done at initial orientation visit.  The plan for exercise progression was also introduced and progression will be customized based on patient's performance and goals.  Saw podiatrist, diagnosed with Plantar Fasciitis. Received cortisone shots and pain has been relieved.  30 day review competed . ITP sent to Dr MEmily Filbertfor review, changes as needed and ITP approval signature.       Comments:

## 2019-03-25 NOTE — Progress Notes (Signed)
Daily Session Note  Patient Details  Name: Gary Davila MRN: [id number] Date of Birth: 05/22/1990 Referring Provider:     Cardiac Rehab from 03/10/2019 in [facility] Cardiac and Pulmonary Rehab  Referring Provider  Devore      Encounter Date: 03/25/2019  Check In: Session Check In - 03/25/19 1530      Check-In   Supervising physician immediately available to respond to emergencies  See telemetry face sheet for immediately available ER MD    Location  [facility]-Cardiac & Pulmonary Rehab    Staff Present  Meredith Craven, RN BSN;Amanda Sommer, BA, ACSM CEP, Exercise Physiologist;Melissa Caiola RDN, LDN    Virtual Visit  No    Medication changes reported      No    Fall or balance concerns reported     No    Warm-up and Cool-down  Performed on first and last piece of equipment    Resistance Training Performed  Yes    VAD Patient?  No    PAD/SET Patient?  No      Pain Assessment   Currently in Pain?  No/denies          Social History   Tobacco Use  Smoking Status Never Smoker  Smokeless Tobacco Never Used    Goals Met:  Independence with exercise equipment Exercise tolerated well No report of cardiac concerns or symptoms Strength training completed today  Goals Unmet:  Not Applicable  Comments: Pt able to follow exercise prescription today without complaint.  Will continue to monitor for progression.    Dr. Mark Miller is Medical Director for [facility] and [facility]. 

## 2019-03-25 NOTE — Progress Notes (Signed)
Patient came into today for casting bilateral f/o to address plantar fasciitis.  Patient reports history of foot pain involving plantar aponeurosis.  Goal is to provide longitudinal arch support and correct any RF instability due to heel eversion/inversion.  Ultimate goal is to relieve tension at pf insertion calcaneal tuberosity.  Plan on semi-rigid device addressing heel stability and relieving PF tension.     Patient is [place] Medicaid, self pay 398 and patient put 100 down at time of casting

## 2019-03-26 ENCOUNTER — Encounter: Payer: Medicaid Other | Admitting: *Deleted

## 2019-03-26 DIAGNOSIS — I5022 Chronic systolic (congestive) heart failure: Secondary | ICD-10-CM

## 2019-03-26 DIAGNOSIS — I110 Hypertensive heart disease with heart failure: Secondary | ICD-10-CM | POA: Diagnosis not present

## 2019-03-26 DIAGNOSIS — I420 Dilated cardiomyopathy: Secondary | ICD-10-CM

## 2019-03-26 NOTE — Progress Notes (Signed)
Daily Session Note  Patient Details  Name: Gary Davila MRN: [id number] Date of Birth: 05/01/1990 Referring Provider:     Cardiac Rehab from 03/10/2019 in [facility] Cardiac and Pulmonary Rehab  Referring Provider  [place]      Encounter Date: 03/26/2019  Check In: Session Check In - 03/26/19 1528      Check-In   Supervising physician immediately available to respond to emergencies  See telemetry face sheet for immediately available ER MD    Location  [facility]-Cardiac & Pulmonary Rehab    Staff Present  Renita Papa, RN BSN;Jessica Luan Pulling, MA, RCEP, CCRP, CCET;Jeanna Durrell BS, Exercise Physiologist    Virtual Visit  No    Medication changes reported      No    Fall or balance concerns reported     No    Warm-up and Cool-down  Performed on first and last piece of equipment    Resistance Training Performed  Yes    VAD Patient?  No    PAD/SET Patient?  No      Pain Assessment   Currently in Pain?  No/denies          Social History   Tobacco Use  Smoking Status Never Smoker  Smokeless Tobacco Never Used    Goals Met:  Independence with exercise equipment Exercise tolerated well No report of cardiac concerns or symptoms Strength training completed today  Goals Unmet:  Not Applicable  Comments: Pt able to follow exercise prescription today without complaint.  Will continue to monitor for progression.    Dr. Emily Filbert is Medical Director for [place] and [facility].

## 2019-03-30 ENCOUNTER — Encounter: Payer: Medicaid Other | Admitting: *Deleted

## 2019-03-30 ENCOUNTER — Other Ambulatory Visit: Payer: Self-pay

## 2019-03-30 DIAGNOSIS — I110 Hypertensive heart disease with heart failure: Secondary | ICD-10-CM | POA: Diagnosis not present

## 2019-03-30 DIAGNOSIS — I5022 Chronic systolic (congestive) heart failure: Secondary | ICD-10-CM

## 2019-03-30 NOTE — Progress Notes (Signed)
Daily Session Note  Patient Details  Name: Gary Davila MRN: [id number] Date of Birth: 12-16-1990 Referring Provider:     Cardiac Rehab from 03/10/2019 in [facility] Cardiac and Pulmonary Rehab  Referring Provider  [place]      Encounter Date: 03/30/2019  Check In: Session Check In - 03/30/19 1557      Check-In   Supervising physician immediately available to respond to emergencies  See telemetry face sheet for immediately available ER MD    Location  [facility]-Cardiac & Pulmonary Rehab    Staff Present  Renita Papa, RN Moises Blood, BS, ACSM CEP, Exercise Physiologist;Joseph Tessie Fass RCP,RRT,BSRT    Virtual Visit  No    Medication changes reported      No    Fall or balance concerns reported     No    Warm-up and Cool-down  Performed on first and last piece of equipment    Resistance Training Performed  Yes    VAD Patient?  No    PAD/SET Patient?  No      Pain Assessment   Currently in Pain?  No/denies          Social History   Tobacco Use  Smoking Status Never Smoker  Smokeless Tobacco Never Used    Goals Met:  Independence with exercise equipment Exercise tolerated well No report of cardiac concerns or symptoms Strength training completed today  Goals Unmet:  Not Applicable  Comments: Pt able to follow exercise prescription today without complaint.  Will continue to monitor for progression.    Dr. Emily Filbert is Medical Director for [place] and [facility].

## 2019-04-01 ENCOUNTER — Other Ambulatory Visit: Payer: Self-pay

## 2019-04-01 ENCOUNTER — Encounter: Payer: Medicaid Other | Admitting: *Deleted

## 2019-04-01 DIAGNOSIS — I110 Hypertensive heart disease with heart failure: Secondary | ICD-10-CM | POA: Diagnosis not present

## 2019-04-01 DIAGNOSIS — I420 Dilated cardiomyopathy: Secondary | ICD-10-CM

## 2019-04-01 DIAGNOSIS — I5022 Chronic systolic (congestive) heart failure: Secondary | ICD-10-CM

## 2019-04-01 NOTE — Progress Notes (Signed)
Daily Session Note  Patient Details  Name: Gary Davila MRN: [id number] Date of Birth: Apr 20, 1991 Referring Provider:     Cardiac Rehab from 03/10/2019 in [facility] Cardiac and Pulmonary Rehab  Referring Provider  [place]      Encounter Date: 04/01/2019  Check In: Session Check In - 04/01/19 1530      Check-In   Supervising physician immediately available to respond to emergencies  See telemetry face sheet for immediately available ER MD    Location  [facility]-Cardiac & Pulmonary Rehab    Staff Present  Renita Papa, RN Vickki Hearing, BA, ACSM CEP, Exercise Physiologist;Melissa Caiola RDN, LDN    Virtual Visit  No    Medication changes reported      No    Fall or balance concerns reported     No    Warm-up and Cool-down  Performed on first and last piece of equipment    Resistance Training Performed  Yes    VAD Patient?  No    PAD/SET Patient?  No      Pain Assessment   Currently in Pain?  No/denies          Social History   Tobacco Use  Smoking Status Never Smoker  Smokeless Tobacco Never Used    Goals Met:  Independence with exercise equipment Exercise tolerated well No report of cardiac concerns or symptoms Strength training completed today  Goals Unmet:  Not Applicable  Comments: Pt able to follow exercise prescription today without complaint.  Will continue to monitor for progression.    Dr. Emily Filbert is Medical Director for [place] and [facility].

## 2019-04-02 ENCOUNTER — Encounter: Payer: Medicaid Other | Admitting: *Deleted

## 2019-04-02 DIAGNOSIS — I5022 Chronic systolic (congestive) heart failure: Secondary | ICD-10-CM

## 2019-04-02 DIAGNOSIS — I420 Dilated cardiomyopathy: Secondary | ICD-10-CM

## 2019-04-02 DIAGNOSIS — I110 Hypertensive heart disease with heart failure: Secondary | ICD-10-CM | POA: Diagnosis not present

## 2019-04-02 NOTE — Progress Notes (Signed)
Daily Session Note  Patient Details  Name: PARVIN STETZER MRN: [id number] Date of Birth: [birth year] Referring Provider:     Cardiac Rehab from 03/10/2019 in [facility] Cardiac and Pulmonary Rehab  Referring Provider  Devore      Encounter Date: 04/02/2019  Check In: Session Check In - 04/02/19 1509      Check-In   Supervising physician immediately available to respond to emergencies  See telemetry face sheet for immediately available ER MD    Location  [facility]-Cardiac & Pulmonary Rehab    Staff Present  Renita Papa, RN BSN;Joseph [address] [place], [place], [place], CCRP, CCET    Virtual Visit  No    Medication changes reported      No    Fall or balance concerns reported     No    Warm-up and Cool-down  Performed on first and last piece of equipment    Resistance Training Performed  Yes    VAD Patient?  No    PAD/SET Patient?  No      Pain Assessment   Currently in Pain?  No/denies          Social History   Tobacco Use  Smoking Status Never Smoker  Smokeless Tobacco Never Used    Goals Met:  Independence with exercise equipment Exercise tolerated well No report of cardiac concerns or symptoms Strength training completed today  Goals Unmet:  Not Applicable  Comments: Pt able to follow exercise prescription today without complaint.  Will continue to monitor for progression.    Dr. Emily Filbert is Medical Director for [place] and [facility].

## 2019-04-02 NOTE — Progress Notes (Signed)
Completed Initial RD Eval 

## 2019-04-09 ENCOUNTER — Other Ambulatory Visit: Payer: Self-pay

## 2019-04-09 ENCOUNTER — Encounter: Payer: Medicaid Other | Admitting: *Deleted

## 2019-04-09 ENCOUNTER — Encounter: Payer: Self-pay | Admitting: Podiatry

## 2019-04-09 ENCOUNTER — Ambulatory Visit (INDEPENDENT_AMBULATORY_CARE_PROVIDER_SITE_OTHER): Payer: Medicaid Other | Admitting: Podiatry

## 2019-04-09 DIAGNOSIS — M722 Plantar fascial fibromatosis: Secondary | ICD-10-CM

## 2019-04-09 DIAGNOSIS — M79671 Pain in right foot: Secondary | ICD-10-CM

## 2019-04-09 DIAGNOSIS — M79672 Pain in left foot: Secondary | ICD-10-CM

## 2019-04-09 DIAGNOSIS — I5022 Chronic systolic (congestive) heart failure: Secondary | ICD-10-CM

## 2019-04-09 DIAGNOSIS — I420 Dilated cardiomyopathy: Secondary | ICD-10-CM

## 2019-04-09 DIAGNOSIS — I110 Hypertensive heart disease with heart failure: Secondary | ICD-10-CM | POA: Diagnosis not present

## 2019-04-09 NOTE — Progress Notes (Signed)
Patient presents for orthotic pick up.  Verbal and written break in and wear instructions given.  Patient will follow up in 4 weeks with Dr if symptoms worsen or fail to improve. 

## 2019-04-09 NOTE — Patient Instructions (Signed)

## 2019-04-09 NOTE — Progress Notes (Signed)
Daily Session Note  Patient Details  Name: Gary Davila MRN: [id number] Date of Birth: Apr 02, 1991 Referring Provider:     Cardiac Rehab from 03/10/2019 in [facility] Cardiac and Pulmonary Rehab  Referring Provider  Devore      Encounter Date: 04/09/2019  Check In: Session Check In - 04/09/19 1117      Check-In   Supervising physician immediately available to respond to emergencies  See telemetry face sheet for immediately available ER MD    Location  [facility]-Cardiac & Pulmonary Rehab    Staff Present  Renita Papa, RN Vickki Hearing, BA, ACSM CEP, Exercise Physiologist;Joseph Tessie Fass RCP,RRT,BSRT    Virtual Visit  No    Medication changes reported      No    Fall or balance concerns reported     No    Warm-up and Cool-down  Performed on first and last piece of equipment    Resistance Training Performed  Yes    VAD Patient?  No    PAD/SET Patient?  No      Pain Assessment   Currently in Pain?  No/denies          Social History   Tobacco Use  Smoking Status Never Smoker  Smokeless Tobacco Never Used    Goals Met:  Independence with exercise equipment Exercise tolerated well No report of cardiac concerns or symptoms Strength training completed today  Goals Unmet:  Not Applicable  Comments: Pt able to follow exercise prescription today without complaint.  Will continue to monitor for progression.    Dr. Emily Filbert is Medical Director for [place] and [facility].

## 2019-04-13 ENCOUNTER — Encounter: Payer: Medicaid Other | Admitting: *Deleted

## 2019-04-13 ENCOUNTER — Other Ambulatory Visit: Payer: Self-pay

## 2019-04-13 DIAGNOSIS — I420 Dilated cardiomyopathy: Secondary | ICD-10-CM

## 2019-04-13 DIAGNOSIS — I5022 Chronic systolic (congestive) heart failure: Secondary | ICD-10-CM

## 2019-04-13 DIAGNOSIS — I110 Hypertensive heart disease with heart failure: Secondary | ICD-10-CM | POA: Diagnosis not present

## 2019-04-13 NOTE — Progress Notes (Signed)
Daily Session Note  Patient Details  Name: Gary Davila MRN: [id number] Date of Birth: 07/11/1990 Referring Provider:     Cardiac Rehab from 03/10/2019 in [facility] Cardiac and Pulmonary Rehab  Referring Provider  Dev[place]      Encounter Date: 04/13/2019  Check In: Session Check In - 04/13/19 1423      Check-In   Supervising physician immediately available to respond to emergencies  See telemetry face sheet for immediately available ER MD    Location  [facility]-Cardiac & Pulmonary Rehab    Staff Present  Renita Papa, RN Moises Blood, BS, ACSM CEP, Exercise Physiologist;Joseph Tessie Fass RCP,RRT,BSRT    Virtual Visit  No    Medication changes reported      No    Fall or balance concerns reported     No    Warm-up and Cool-down  Performed on first and last piece of equipment    Resistance Training Performed  Yes    VAD Patient?  No    PAD/SET Patient?  No      Pain Assessment   Currently in Pain?  No/denies          Social History   Tobacco Use  Smoking Status Never Smoker  Smokeless Tobacco Never Used    Goals Met:  Independence with exercise equipment Exercise tolerated well No report of cardiac concerns or symptoms Strength training completed today  Goals Unmet:  Not Applicable  Comments: Pt able to follow exercise prescription today without complaint.  Will continue to monitor for progression.    Dr. Emily Filbert is Medical Director for [place] and [facility].

## 2019-04-20 ENCOUNTER — Other Ambulatory Visit: Payer: Self-pay

## 2019-04-20 ENCOUNTER — Encounter: Payer: Medicaid Other | Admitting: *Deleted

## 2019-04-20 DIAGNOSIS — I420 Dilated cardiomyopathy: Secondary | ICD-10-CM

## 2019-04-20 DIAGNOSIS — I5022 Chronic systolic (congestive) heart failure: Secondary | ICD-10-CM

## 2019-04-20 DIAGNOSIS — I110 Hypertensive heart disease with heart failure: Secondary | ICD-10-CM | POA: Diagnosis not present

## 2019-04-20 NOTE — Progress Notes (Signed)
Daily Session Note  Patient Details  Name: EDY BELT MRN: [id number] Date of Birth: [birth year] Referring Provider:     Cardiac Rehab from 03/10/2019 in [facility] Cardiac and Pulmonary Rehab  Referring Provider  [place]      Encounter Date: 04/20/2019  Check In: Session Check In - 04/20/19 1428      Check-In   Supervising physician immediately available to respond to emergencies  See telemetry face sheet for immediately available ER MD    Location  [facility]-Cardiac & Pulmonary Rehab    Staff Present  Renita Papa, RN BSN;Mary Kellie Shropshire, RN, BSN, Lauretta Grill RCP,RRT,BSRT    Virtual Visit  No    Medication changes reported      No    Fall or balance concerns reported     No    Warm-up and Cool-down  Performed on first and last piece of equipment    Resistance Training Performed  Yes    VAD Patient?  No    PAD/SET Patient?  No      Pain Assessment   Currently in Pain?  No/denies          Social History   Tobacco Use  Smoking Status Never Smoker  Smokeless Tobacco Never Used    Goals Met:  Independence with exercise equipment Exercise tolerated well No report of cardiac concerns or symptoms Strength training completed today  Goals Unmet:  Not Applicable  Comments: Pt able to follow exercise prescription today without complaint.  Will continue to monitor for progression.    Dr. Emily Filbert is Medical Director for [place] and [facility].

## 2019-04-22 ENCOUNTER — Encounter: Payer: Self-pay | Admitting: *Deleted

## 2019-04-22 DIAGNOSIS — I5022 Chronic systolic (congestive) heart failure: Secondary | ICD-10-CM

## 2019-04-22 DIAGNOSIS — I420 Dilated cardiomyopathy: Secondary | ICD-10-CM

## 2019-04-22 NOTE — Progress Notes (Signed)
Cardiac Individual Treatment Plan  Patient Details  Name: TARIS GALINDO MRN: [id number] Date of Birth: 10-11-1990 Referring Provider:     Cardiac Rehab from 03/10/2019 in [facility] Cardiac and Pulmonary Rehab  Referring Provider  Devore      Initial Encounter Date:    Cardiac Rehab from 03/10/2019 in [facility] Cardiac and Pulmonary Rehab  Date  03/10/19      Visit Diagnosis: Heart failure, chronic systolic ([place])  Dilated cardiomyopathy ([place])  Patient's Home Medications on Admission:  Current Outpatient Medications:  .  carvedilol (COREG) 25 MG tablet, TAKE (1) TABLET TWICE A DAY., Disp: , Rfl:  .  digoxin (LANOXIN) 0.125 MG tablet, TAKE 1 TABLET DAILY., Disp: , Rfl:  .  empagliflozin (JARDIANCE) 10 MG TABS tablet, Take by mouth., Disp: , Rfl:  .  eplerenone (INSPRA) 25 MG tablet, TAKE 1 TABLET DAILY., Disp: , Rfl:  .  furosemide (LASIX) 20 MG tablet, Take 40 mg by mouth 2 (two) times daily., Disp: , Rfl:  .  ibuprofen (ADVIL,MOTRIN) 200 MG tablet, Take by mouth., Disp: , Rfl:  .  sacubitril-valsartan (ENTRESTO) 24-26 MG, Take by mouth., Disp: , Rfl:  .  warfarin (COUMADIN) 5 MG tablet, Take 7.34m (one and half pill of 519m on Tues / Thurs, then 58m66mvery other day., Disp: , Rfl:   Past Medical History: Past Medical History:  Diagnosis Date  . [place] ([place] . Chronic CHF (congestive heart failure) ([place] . Coarctation of aorta   . Dilated cardiomyopathy ([place] . Hypertension   . ICD (implantable cardioverter-defibrillator) in place   . ICD (implantable cardioverter-defibrillator) lead failure   . Infection of pacemaker pocket ([place]CC[place] . Left ventricular diastolic dysfunction, NYHA class 2   . Obesity   . Ventricular tachycardia (HCC)     Tobacco Use: Social History   Tobacco Use  Smoking Status Never Smoker  Smokeless Tobacco Never Used    Labs: Recent Review Flowsheet Data    There is no flowsheet data to display.       Exercise Target Goals: Exercise Program  Goal: Individual exercise prescription set using results from initial 6 min walk test and THRR while considering  patient's activity barriers and safety.   Exercise Prescription Goal: Initial exercise prescription builds to 30-45 minutes a day of aerobic activity, 2-3 days per week.  Home exercise guidelines will be given to patient during program as part of exercise prescription that the participant will acknowledge.  Activity Barriers & Risk Stratification: Activity Barriers & Cardiac Risk Stratification - 02/25/19 1512      Activity Barriers & Cardiac Risk Stratification   Activity Barriers  Deconditioning;Balance Concerns;Assistive Device;Muscular Weakness;Shortness of Breath;Other (comment);History of Falls    Comments  problems with knees and ankles from possible nueropathy    Cardiac Risk Stratification  High       6 Minute Walk: 6 Minute Walk    Row Name 03/10/19 1653         6 Minute Walk   Distance  430 feet     Walk Time  6 minutes     # of Rest Breaks  0     MPH  0.8     METS  3.04     RPE  11     Perceived Dyspnea   0     VO2 Peak  10.65     Symptoms  Yes (comment)     Comments  foot pain - plantar  fascitis 3/10     Resting HR  50 bpm     Resting BP  94/52     Resting Oxygen Saturation   99 %     Exercise Oxygen Saturation  during 6 min walk  97 %     Max Ex. HR  84 bpm     Max Ex. BP  94/54        Oxygen Initial Assessment:   Oxygen Re-Evaluation:   Oxygen Discharge (Final Oxygen Re-Evaluation):   Initial Exercise Prescription: Initial Exercise Prescription - 03/10/19 1700      Date of Initial Exercise RX and Referring Provider   Date  03/10/19    Referring Provider  Devore      Treadmill   MPH  0.5    Grade  0    Minutes  15      Recumbant Bike   Level  1    RPM  60    Minutes  15    METs  3      NuStep   Level  2    SPM  80    Minutes  15    METs  3      Arm Ergometer   Level  1    RPM  25    Minutes  15    METs  3       REL-XR   Level  2    Speed  50    Minutes  15    METs  3      T5 Nustep   Level  1    SPM  80    Minutes  15    METs  3      Biostep-RELP   Level  2    SPM  50    Minutes  15    METs  3      Prescription Details   Frequency (times per week)  3    Duration  Progress to 30 minutes of continuous aerobic without signs/symptoms of physical distress      Intensity   THRR 40-80% of Max Heartrate  106-162    Ratings of Perceived Exertion  11-13    Perceived Dyspnea  0-4      Resistance Training   Training Prescription  Yes    Weight  3 lb    Reps  10-15       Perform Capillary Blood Glucose checks as needed.  Exercise Prescription Changes: Exercise Prescription Changes    Row Name 03/10/19 1700 04/02/19 1400 04/13/19 1500 04/15/19 1200       Response to Exercise   Blood Pressure (Admit)  94/52  94/50  --  88/62    Blood Pressure (Exercise)  94/54  92/66  --  102/62    Blood Pressure (Exit)  --  94/60  --  104/54    Heart Rate (Admit)  50 bpm  66 bpm  --  79 bpm    Heart Rate (Exercise)  84 bpm  95 bpm  --  96 bpm    Heart Rate (Exit)  72 bpm  73 bpm  --  76 bpm    Oxygen Saturation (Admit)  99 %  --  --  --    Oxygen Saturation (Exercise)  97 %  --  --  --    Rating of Perceived Exertion (Exercise)  11  11  --  13    Perceived Dyspnea (Exercise)  0  --  --  --  Symptoms  leg/foot  pain  plantar fascitis  --  --  none    Duration  --  --  --  Continue with 30 min of aerobic exercise without signs/symptoms of physical distress.    Intensity  --  --  --  THRR unchanged      Progression   Progression  --  Continue to progress workloads to maintain intensity without signs/symptoms of physical distress.  --  Continue to progress workloads to maintain intensity without signs/symptoms of physical distress.    Average METs  --  1.8  --  1.63      Resistance Training   Training Prescription  --  Yes  --  Yes    Weight  --  3 lb  --  3 lb    Reps  --  10-15  --  10-15       Interval Training   Interval Training  --  No  --  No      Treadmill   MPH  --  0.5  --  --    Grade  --  0  --  --    Minutes  --  15  --  --      NuStep   Level  --  2  --  3    SPM  --  80  --  --    Minutes  --  15  --  15    METs  --  2.1  --  2      Arm Ergometer   Level  --  1  --  1    RPM  --  25  --  --    Minutes  --  15  --  15    METs  --  1.7  --  1.5      REL-XR   Level  --  --  --  1    Minutes  --  --  --  15    METs  --  --  --  1.4      Biostep-RELP   Level  --  2  --  --    SPM  --  50  --  --    Minutes  --  15  --  --    METs  --  2  --  --      Home Exercise Plan   Plans to continue exercise at  --  --  Home (comment) walking, staff videos  Home (comment) walking, staff videos    Frequency  --  --  Add 3 additional days to program exercise sessions.  Add 3 additional days to program exercise sessions.    Initial Home Exercises Provided  --  --  04/13/19  04/13/19       Exercise Comments: Exercise Comments    Row Name 03/16/19 1536           Exercise Comments  First full day of exercise!  Patient was oriented to gym and equipment including functions, settings, policies, and procedures.  Patient's individual exercise prescription and treatment plan were reviewed.  All starting workloads were established based on the results of the 6 minute walk test done at initial orientation visit.  The plan for exercise progression was also introduced and progression will be customized based on patient's performance and goals.          Exercise Goals and Review: Exercise Goals    Row  Name 03/10/19 1704             Exercise Goals   Increase Physical Activity  Yes       Intervention  Provide advice, education, support and counseling about physical activity/exercise needs.;Develop an individualized exercise prescription for aerobic and resistive training based on initial evaluation findings, risk stratification, comorbidities and participant's  personal goals.       Expected Outcomes  Short Term: Attend rehab on a regular basis to increase amount of physical activity.;Long Term: Add in home exercise to make exercise part of routine and to increase amount of physical activity.;Long Term: Exercising regularly at least 3-5 days a week.       Increase Strength and Stamina  Yes       Intervention  Provide advice, education, support and counseling about physical activity/exercise needs.;Develop an individualized exercise prescription for aerobic and resistive training based on initial evaluation findings, risk stratification, comorbidities and participant's personal goals.       Expected Outcomes  Short Term: Increase workloads from initial exercise prescription for resistance, speed, and METs.;Short Term: Perform resistance training exercises routinely during rehab and add in resistance training at home;Long Term: Improve cardiorespiratory fitness, muscular endurance and strength as measured by increased METs and functional capacity (6MWT)       Able to understand and use rate of perceived exertion (RPE) scale  Yes       Intervention  Provide education and explanation on how to use RPE scale       Expected Outcomes  Short Term: Able to use RPE daily in rehab to express subjective intensity level;Long Term:  Able to use RPE to guide intensity level when exercising independently       Knowledge and understanding of Target Heart Rate Range (THRR)  Yes       Intervention  Provide education and explanation of THRR including how the numbers were predicted and where they are located for reference       Expected Outcomes  Short Term: Able to state/look up THRR;Short Term: Able to use daily as guideline for intensity in rehab;Long Term: Able to use THRR to govern intensity when exercising independently       Understanding of Exercise Prescription  Yes       Intervention  Provide education, explanation, and written materials on patient's individual exercise  prescription       Expected Outcomes  Short Term: Able to explain program exercise prescription;Long Term: Able to explain home exercise prescription to exercise independently          Exercise Goals Re-Evaluation : Exercise Goals Re-Evaluation    Row Name 03/16/19 1537 04/02/19 1456 04/13/19 1527         Exercise Goal Re-Evaluation   Exercise Goals Review  Increase Physical Activity;Knowledge and understanding of Target Heart Rate Range (THRR);Able to check pulse independently;Understanding of Exercise Prescription;Able to understand and use rate of perceived exertion (RPE) scale  Increase Physical Activity;Increase Strength and Stamina;Able to understand and use rate of perceived exertion (RPE) scale;Able to understand and use Dyspnea scale;Knowledge and understanding of Target Heart Rate Range (THRR);Understanding of Exercise Prescription  Increase Physical Activity;Increase Strength and Stamina;Able to understand and use rate of perceived exertion (RPE) scale;Able to understand and use Dyspnea scale;Knowledge and understanding of Target Heart Rate Range (THRR);Understanding of Exercise Prescription;Able to check pulse independently     Comments  Reviewed RPE scale, THR and program prescription with pt today.  Pt voiced understanding and was given  a copy of goals to take home.  [name] is doing well in rehab.  he has had some ankle pain but continues to attend and try all machines.  Staff will monitor progess.  [name] is doing well in rehab.  He has started to be able to move more at home.  He has no real routine going.  Reviewed home exercise with pt today.  Pt plans to walk and use staff videos at home for exercise.  Reviewed THR, pulse, RPE, sign and symptoms, and when to call 911 or MD.  Also discussed weather considerations and indoor options.  Pt voiced understanding.     Expected Outcomes  Short: Use RPE daily to regulate intensity. Long: Follow program prescription in THR.  Short - conitnue to  attend consistently Long - improve stamina  Short: Start to add in exercise at home.   Long: Continue to improve stamina.        Discharge Exercise Prescription (Final Exercise Prescription Changes): Exercise Prescription Changes - 04/15/19 1200      Response to Exercise   Blood Pressure (Admit)  88/62    Blood Pressure (Exercise)  102/62    Blood Pressure (Exit)  104/54    Heart Rate (Admit)  79 bpm    Heart Rate (Exercise)  96 bpm    Heart Rate (Exit)  76 bpm    Rating of Perceived Exertion (Exercise)  13    Symptoms  none    Duration  Continue with 30 min of aerobic exercise without signs/symptoms of physical distress.    Intensity  THRR unchanged      Progression   Progression  Continue to progress workloads to maintain intensity without signs/symptoms of physical distress.    Average METs  1.63      Resistance Training   Training Prescription  Yes    Weight  3 lb    Reps  10-15      Interval Training   Interval Training  No      NuStep   Level  3    Minutes  15    METs  2      Arm Ergometer   Level  1    Minutes  15    METs  1.5      REL-XR   Level  1    Minutes  15    METs  1.4      Home Exercise Plan   Plans to continue exercise at  Home (comment)   walking, staff videos   Frequency  Add 3 additional days to program exercise sessions.    Initial Home Exercises Provided  04/13/19       Nutrition:  Target Goals: Understanding of nutrition guidelines, daily intake of sodium <1535m, cholesterol <2055m calories 30% from fat and 7% or less from saturated fats, daily to have 5 or more servings of fruits and vegetables.  Biometrics:    Nutrition Therapy Plan and Nutrition Goals: Nutrition Therapy & Goals - 04/02/19 1102      Nutrition Therapy   Diet  Low Na, HH diet    Drug/Food Interactions  Coumadin/Vit K    Protein (specify units)  75g    Fiber  30 grams    Whole Grain Foods  3 servings    Saturated Fats  12 max. grams    Fruits and  Vegetables  5 servings/day    Sodium  1.5 grams      Personal Nutrition Goals   Nutrition  Goal  ST: switch out one glass of sugar-sweetened beverage with water LT: increase strength and muscle mass    Comments  B: bowl of cereal cheerios, cinnamon toast crunch, rice krispies (whole milk). L: chicken with vegetables sometimes D: varies (meat, starch, vegetable) - red meat or poultry. Drinks: sweet tea or soda. 2-3 glasses/can. Discussed HH eating and MNT for muscle building.      Intervention Plan   Intervention  Prescribe, educate and counsel regarding individualized specific dietary modifications aiming towards targeted core components such as weight, hypertension, lipid management, diabetes, heart failure and other comorbidities.;Nutrition handout(s) given to patient.    Expected Outcomes  Short Term Goal: Understand basic principles of dietary content, such as calories, fat, sodium, cholesterol and nutrients.;Short Term Goal: A plan has been developed with personal nutrition goals set during dietitian appointment.;Long Term Goal: Adherence to prescribed nutrition plan.       Nutrition Assessments:   Nutrition Goals Re-Evaluation:   Nutrition Goals Discharge (Final Nutrition Goals Re-Evaluation):   Psychosocial: Target Goals: Acknowledge presence or absence of significant depression and/or stress, maximize coping skills, provide positive support system. Participant is able to verbalize types and ability to use techniques and skills needed for reducing stress and depression.   Initial Review & Psychosocial Screening: Initial Psych Review & Screening - 02/25/19 1514      Initial Review   Current issues with  Current Stress Concerns    Source of Stress Concerns  Chronic Illness    Comments  Been dealing with heart issues since birth, first noted in 2005      [place]?  Yes   mom, aunt   Concerns  Recent loss of significant other    Comments  Recently  lost father in August      Barriers   Psychosocial barriers to participate in program  The patient should benefit from training in stress management and relaxation.;Psychosocial barriers identified (see note)      Screening Interventions   Interventions  Encouraged to exercise;To provide support and resources with identified psychosocial needs;Provide feedback about the scores to participant    Expected Outcomes  Short Term goal: Utilizing psychosocial counselor, staff and physician to assist with identification of specific Stressors or current issues interfering with healing process. Setting desired goal for each stressor or current issue identified.;Long Term Goal: Stressors or current issues are controlled or eliminated.;Short Term goal: Identification and review with participant of any Quality of Life or Depression concerns found by scoring the questionnaire.;Long Term goal: The participant improves quality of Life and PHQ9 Scores as seen by post scores and/or verbalization of changes       Quality of Life Scores:  Quality of Life - 03/10/19 1706      Quality of Life   Select  Quality of Life      Quality of Life Scores   Health/Function Pre  20.43 %    Socioeconomic Pre  23.29 %    Psych/Spiritual Pre  21.83 %    Family Pre  26.67 %    GLOBAL Pre  21.95 %      Scores of 19 and below usually indicate a poorer quality of life in these areas.  A difference of  2-3 points is a clinically meaningful difference.  A difference of 2-3 points in the total score of the Quality of Life Index has been associated with significant improvement in overall quality of life, self-image, physical symptoms, and general health  in studies assessing change in quality of life.  PHQ-9: Recent Review Flowsheet Data    Depression screen [facility] 2/9 03/10/2019 03/04/2019   Decreased Interest 0 0   Down, Depressed, Hopeless 0 0   PHQ - 2 Score 0 0   Altered sleeping 0 -   Tired, decreased energy 1 -   Change  in appetite 0 -   Feeling bad or failure about yourself  0 -   Trouble concentrating 0 -   Moving slowly or fidgety/restless 3 -   Suicidal thoughts 0 -   PHQ-9 Score 4 -   Difficult doing work/chores Somewhat difficult -     Interpretation of Total Score  Total Score Depression Severity:  1-4 = Minimal depression, 5-9 = Mild depression, 10-14 = Moderate depression, 15-19 = Moderately severe depression, 20-27 = Severe depression   Psychosocial Evaluation and Intervention:   Psychosocial Re-Evaluation: Psychosocial Re-Evaluation    Lindsay Name 04/13/19 1530             Psychosocial Re-Evaluation   Current issues with  Current Depression;Current Stress Concerns       Comments  Matt's family is very involved in his life.  His aunt has started to come to class with him, but we will try to keep them separated to let him have some time to himself.  She tried to get him to get a cortizone shot for his wrist he injured.  He is starting to want some more independence and has been more active at home.  Overall, he is feeling better overall.       Expected Outcomes  Short: Continue to attend class to build indepence  Long: Continue stay positive.       Interventions  Encouraged to attend Cardiac Rehabilitation for the exercise       Continue Psychosocial Services   Follow up required by staff       Comments  Been dealing with heart issues since birth, first noted in 2005         Initial Review   Source of Stress Concerns  Chronic Illness          Psychosocial Discharge (Final Psychosocial Re-Evaluation): Psychosocial Re-Evaluation - 04/13/19 1530      Psychosocial Re-Evaluation   Current issues with  Current Depression;Current Stress Concerns    Comments  Matt's family is very involved in his life.  His aunt has started to come to class with him, but we will try to keep them separated to let him have some time to himself.  She tried to get him to get a cortizone shot for his wrist he  injured.  He is starting to want some more independence and has been more active at home.  Overall, he is feeling better overall.    Expected Outcomes  Short: Continue to attend class to build indepence  Long: Continue stay positive.    Interventions  Encouraged to attend Cardiac Rehabilitation for the exercise    Continue Psychosocial Services   Follow up required by staff    Comments  Been dealing with heart issues since birth, first noted in 2005      Initial Review   Source of Stress Concerns  Chronic Illness       Vocational Rehabilitation: Provide vocational rehab assistance to qualifying candidates.   Vocational Rehab Evaluation & Intervention: Vocational Rehab - 02/25/19 1516      Initial Vocational Rehab Evaluation & Intervention   Assessment shows need for Vocational  Rehabilitation  No       Education: Education Goals: Education classes will be provided on a variety of topics geared toward better understanding of heart health and risk factor modification. Participant will state understanding/return demonstration of topics presented as noted by education test scores.  Learning Barriers/Preferences: Learning Barriers/Preferences - 02/25/19 1513      Learning Barriers/Preferences   Learning Barriers  None    Learning Preferences  Pictoral       Education Topics:  AED/CPR: - Group verbal and written instruction with the use of models to demonstrate the basic use of the AED with the basic ABC's of resuscitation.   General Nutrition Guidelines/Fats and Fiber: -Group instruction provided by verbal, written material, models and posters to present the general guidelines for heart healthy nutrition. Gives an explanation and review of dietary fats and fiber.   Controlling Sodium/Reading Food Labels: -Group verbal and written material supporting the discussion of sodium use in heart healthy nutrition. Review and explanation with models, verbal and written materials for  utilization of the food label.   Exercise Physiology & General Exercise Guidelines: - Group verbal and written instruction with models to review the exercise physiology of the cardiovascular system and associated critical values. Provides general exercise guidelines with specific guidelines to those with heart or lung disease.    Aerobic Exercise & Resistance Training: - Gives group verbal and written instruction on the various components of exercise. Focuses on aerobic and resistive training programs and the benefits of this training and how to safely progress through these programs..   Flexibility, Balance, Mind/Body Relaxation: Provides group verbal/written instruction on the benefits of flexibility and balance training, including mind/body exercise modes such as yoga, pilates and tai chi.  Demonstration and skill practice provided.   Cardiac Rehab from 03/26/2019 in [facility] Cardiac and Pulmonary Rehab  Date  03/26/19  Educator  AS  Instruction Review Code  1- Verbalizes Understanding      Stress and Anxiety: - Provides group verbal and written instruction about the health risks of elevated stress and causes of high stress.  Discuss the correlation between heart/lung disease and anxiety and treatment options. Review healthy ways to manage with stress and anxiety.   Depression: - Provides group verbal and written instruction on the correlation between heart/lung disease and depressed mood, treatment options, and the stigmas associated with seeking treatment.   Anatomy & Physiology of the Heart: - Group verbal and written instruction and models provide basic cardiac anatomy and physiology, with the coronary electrical and arterial systems. Review of Valvular disease and Heart Failure   Cardiac Procedures: - Group verbal and written instruction to review commonly prescribed medications for heart disease. Reviews the medication, class of the drug, and side effects. Includes the steps to  properly store meds and maintain the prescription regimen. (beta blockers and nitrates)   Cardiac Medications I: - Group verbal and written instruction to review commonly prescribed medications for heart disease. Reviews the medication, class of the drug, and side effects. Includes the steps to properly store meds and maintain the prescription regimen.   Cardiac Medications II: -Group verbal and written instruction to review commonly prescribed medications for heart disease. Reviews the medication, class of the drug, and side effects. (all other drug classes)    Go Sex-Intimacy & Heart Disease, Get SMART - Goal Setting: - Group verbal and written instruction through game format to discuss heart disease and the return to sexual intimacy. Provides group verbal and written material to  discuss and apply goal setting through the application of the S.M.A.R.T. Method.   Other Matters of the Heart: - Provides group verbal, written materials and models to describe Stable Angina and Peripheral Artery. Includes description of the disease process and treatment options available to the cardiac patient.   Exercise & Equipment Safety: - Individual verbal instruction and demonstration of equipment use and safety with use of the equipment.   Cardiac Rehab from 03/26/2019 in [facility] Cardiac and Pulmonary Rehab  Date  03/10/19  Educator  AS  Instruction Review Code  1- Verbalizes Understanding      Infection Prevention: - Provides verbal and written material to individual with discussion of infection control including proper hand washing and proper equipment cleaning during exercise session.   Cardiac Rehab from 03/26/2019 in [facility] Cardiac and Pulmonary Rehab  Date  03/10/19  Educator  AS  Instruction Review Code  1- Verbalizes Understanding      Falls Prevention: - Provides verbal and written material to individual with discussion of falls prevention and safety.   Cardiac Rehab from 03/26/2019 in [facility]  Cardiac and Pulmonary Rehab  Date  03/10/19  Educator  AS  Instruction Review Code  1- Verbalizes Understanding      Diabetes: - Individual verbal and written instruction to review signs/symptoms of diabetes, desired ranges of glucose level fasting, after meals and with exercise. Acknowledge that pre and post exercise glucose checks will be done for 3 sessions at entry of program.   Know Your Numbers and Risk Factors: -Group verbal and written instruction about important numbers in your health.  Discussion of what are risk factors and how they play a role in the disease process.  Review of Cholesterol, Blood Pressure, Diabetes, and BMI and the role they play in your overall health.   Sleep Hygiene: -Provides group verbal and written instruction about how sleep can affect your health.  Define sleep hygiene, discuss sleep cycles and impact of sleep habits. Review good sleep hygiene tips.    Other: -Provides group and verbal instruction on various topics (see comments)   Knowledge Questionnaire Score:   Core Components/Risk Factors/Patient Goals at Admission: Personal Goals and Risk Factors at Admission - 03/10/19 1706      Core Components/Risk Factors/Patient Goals on Admission    Weight Management  Yes;Obesity;Weight Loss    Intervention  Weight Management: Develop a combined nutrition and exercise program designed to reach desired caloric intake, while maintaining appropriate intake of nutrient and fiber, sodium and fats, and appropriate energy expenditure required for the weight goal.;Weight Management: Provide education and appropriate resources to help participant work on and attain dietary goals.;Weight Management/Obesity: Establish reasonable short term and long term weight goals.;Obesity: Provide education and appropriate resources to help participant work on and attain dietary goals.    Admit Weight  199 lb 8 oz (90.5 kg)    Goal Weight: Short Term  195 lb (88.5 kg)    Goal  Weight: Long Term  190 lb (86.2 kg)    Expected Outcomes  Short Term: Continue to assess and modify interventions until short term weight is achieved;Long Term: Adherence to nutrition and physical activity/exercise program aimed toward attainment of established weight goal;Weight Loss: Understanding of general recommendations for a balanced deficit meal plan, which promotes 1-2 lb weight loss per week and includes a negative energy balance of [phone] kcal/d;Understanding recommendations for meals to include 15-35% energy as protein, 25-35% energy from fat, 35-60% energy from carbohydrates, less than 253m of dietary cholesterol,  20-35 gm of total fiber daily;Understanding of distribution of calorie intake throughout the day with the consumption of 4-5 meals/snacks       Core Components/Risk Factors/Patient Goals Review:  Goals and Risk Factor Review    Row Name 04/13/19 1534             Core Components/Risk Factors/Patient Goals Review   Personal Goals Review  Weight Management/Obesity;Heart Failure;Hypertension       Review  When Matt first started, he lost a lot of weight very quickly.  It has now since stabilized and he is doing well now.  His pressures continue to do well.  He has not had symptoms of heart failure. He injured his wrist and it is swollen some. He is using ice and heat to treat       Expected Outcomes  Short: Continue to improve strength and maintain weight.  Long: Continue to manage heart failure.          Core Components/Risk Factors/Patient Goals at Discharge (Final Review):  Goals and Risk Factor Review - 04/13/19 1534      Core Components/Risk Factors/Patient Goals Review   Personal Goals Review  Weight Management/Obesity;Heart Failure;Hypertension    Review  When Matt first started, he lost a lot of weight very quickly.  It has now since stabilized and he is doing well now.  His pressures continue to do well.  He has not had symptoms of heart failure. He injured his  wrist and it is swollen some. He is using ice and heat to treat    Expected Outcomes  Short: Continue to improve strength and maintain weight.  Long: Continue to manage heart failure.       ITP Comments: ITP Comments    Row Name 02/25/19 1531 03/16/19 1536 03/16/19 1551 03/25/19 1059 04/02/19 1132   ITP Comments  Virtual Orientation Completed.  Documentation for diagnosis can be found in [place] encounter 02/11/19.  EP eval scheduled for 11/17 at 330pm  First full day of exercise!  Patient was oriented to gym and equipment including functions, settings, policies, and procedures.  Patient's individual exercise prescription and treatment plan were reviewed.  All starting workloads were established based on the results of the 6 minute walk test done at initial orientation visit.  The plan for exercise progression was also introduced and progression will be customized based on patient's performance and goals.  Saw podiatrist, diagnosed with Plantar Fasciitis. Received cortisone shots and pain has been relieved.  30 day review competed . ITP sent to Dr Emily Filbert for review, changes as needed and ITP approval signature.  Completed Initial RD Eval   Row Name 04/22/19 0925           ITP Comments  30 day review competed . ITP sent to Dr Emily Filbert for review, changes as needed and ITP approval signature          Comments:

## 2019-04-27 ENCOUNTER — Telehealth: Payer: Self-pay

## 2019-04-27 NOTE — Telephone Encounter (Signed)
No VM  

## 2019-04-29 ENCOUNTER — Encounter: Payer: Self-pay | Admitting: Family Medicine

## 2019-04-29 ENCOUNTER — Other Ambulatory Visit: Payer: Self-pay

## 2019-04-29 ENCOUNTER — Other Ambulatory Visit: Payer: Medicaid Other | Admitting: Orthotics

## 2019-04-29 ENCOUNTER — Ambulatory Visit (INDEPENDENT_AMBULATORY_CARE_PROVIDER_SITE_OTHER): Payer: Medicaid Other | Admitting: Family Medicine

## 2019-04-29 DIAGNOSIS — M25532 Pain in left wrist: Secondary | ICD-10-CM | POA: Diagnosis not present

## 2019-04-29 DIAGNOSIS — M778 Other enthesopathies, not elsewhere classified: Secondary | ICD-10-CM | POA: Diagnosis not present

## 2019-04-29 MED ORDER — DICLOFENAC SODIUM 1 % EX GEL: 2.0000 g | Freq: Three times a day (TID) | CUTANEOUS | 1 refills | Status: DC | PRN

## 2019-04-29 NOTE — Progress Notes (Signed)
Virtual Visit via Telephone The purpose of this virtual visit is to provide medical care while limiting exposure to the novel coronavirus (COVID19) for both patient and office staff.  Consent was obtained for phone visit:  Yes.   Answered questions that patient had about telehealth interaction:  Yes.   I discussed the limitations, risks, security and privacy concerns of performing an evaluation and management service by telephone. I also discussed with the patient that there may be a patient responsible charge related to this service. The patient expressed understanding and agreed to proceed.  Patient Location: Home Provider Location: [facility])  ---------------------------------------------------------------------- Chief Complaint  Patient presents with  . Wrist Pain    onset month    S: Reviewed CMA documentation. I have called patient and gathered additional HPI as follows:  Left Wrist Pain He is right handed. Reports worsening over past 1 month, he said woke up one day with it causing pain, difficulty grasping with left hand and worse with moving fingers was painful, he did have some localized swelling in wrist and hand but not in fingers. - Overall gradual improvement over past 1 month now. He has tried using ice packs and heating pad with some relief. Tried temporary wrist splint but no longer using. He does do repetitive typing asking if this could be causing it. - Now it is bothersome to him still, difficulty tying shoes and squeezing fist tightly - He can take some Ibuprofen occasionally but not regularly. He takes Tylenol PRN some relief  Denies any fevers, chills, sweats, body ache, cough, shortness of breath, sinus pain or pressure, headache, abdominal pain, diarrhea  Past Medical History:  Diagnosis Date  . A-fib ([place])   . Chronic CHF (congestive heart failure) ([place])   . Coarctation of aorta   . Dilated cardiomyopathy ([place])   . Hypertension    . ICD (implantable cardioverter-defibrillator) in place   . ICD (implantable cardioverter-defibrillator) lead failure   . 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 Left ventricular diastolic dysfunction, NYHA class 2   . Obesity   . Ventricular tachycardia ([facility])    Social History   Tobacco Use  . Smoking status: Never Smoker  . Smokeless tobacco: Never Used  Substance Use Topics  . Alcohol use: No  . Drug use: No    Current Outpatient Medications:  .  carvedilol (COREG) 25 MG tablet, TAKE (1) TABLET TWICE A DAY., Disp: , Rfl:  .  digoxin (LANOXIN) 0.125 MG tablet, TAKE 1 TABLET DAILY., Disp: , Rfl:  .  eplerenone (INSPRA) 25 MG tablet, TAKE 1 TABLET DAILY., Disp: , Rfl:  .  furosemide (LASIX) 20 MG tablet, Take 40 mg by mouth 2 (two) times daily., Disp: , Rfl:  .  ibuprofen (ADVIL,MOTRIN) 200 MG tablet, Take by mouth., Disp: , Rfl:  .  sacubitril-valsartan (ENTRESTO) 24-26 MG, Take by mouth., Disp: , Rfl:  .  warfarin (COUMADIN) 5 MG tablet, Take 7.5mg  (one and half pill of 5mg ) on Tues / Thurs, then 5mg  every other day., Disp: , Rfl:  .  diclofenac Sodium (VOLTAREN) 1 % GEL, Apply 2 g topically 3 (three) times daily as needed., Disp: 100 g, Rfl: 1 .  empagliflozin (JARDIANCE) 10 MG TABS tablet, Take by mouth., Disp: , Rfl:   Depression screen [facility] 2/9 04/29/2019 03/10/2019 03/04/2019  Decreased Interest 0 0 0  Down, Depressed, Hopeless 0 0 0  PHQ - 2 Score 0 0 0  Altered sleeping - 0 -  Tired, decreased energy - 1 -  Change in appetite - 0 -  Feeling bad or failure about yourself  - 0 -  Trouble concentrating - 0 -  Moving slowly or fidgety/restless - 3 -  Suicidal thoughts - 0 -  PHQ-9 Score - 4 -  Difficult doing work/chores - Somewhat difficult -    No flowsheet data found.  -------------------------------------------------------------------------- O: No physical exam performed due to remote telephone encounter.  Lab results reviewed.  No results found for this or  any previous visit (from the past 2160 hour(s)).  -------------------------------------------------------------------------- A&P:  Problem List Items Addressed This Visit    None    Visit Diagnoses    Wrist pain, acute, left    -  Primary   Relevant Medications   diclofenac Sodium (VOLTAREN) 1 % GEL   Left wrist tendonitis       Relevant Medications   diclofenac Sodium (VOLTAREN) 1 % GEL     Suspected Left Wrist tendonitis due to recent repetitive overuse Improving gradually with conservative care Reassuring history no trauma or injury  Plan: 1. Start anti-inflammatory topical diclofenac TID PRN sent rx 2. May take Tylenol PRN breakthrough 3. Try Ulnar splint vs Thumb Spica Wrist Splint (OTC) for support and avoid repetitive strain, allow tendons to heal. Activity modification and relative rest 4. Use topical heat/ice PRN 5. Follow-up if not improving 4-6 weeks   Meds ordered this encounter  Medications  . diclofenac Sodium (VOLTAREN) 1 % GEL    Sig: Apply 2 g topically 3 (three) times daily as needed.    Dispense:  100 g    Refill:  1    Follow-up: - Return in 4-6 weeks as needed L wrist pain if not improved  Patient verbalizes understanding with the above medical recommendations including the limitation of remote medical advice.  Specific follow-up and call-back criteria were given for patient to follow-up or seek medical care more urgently if needed.   - Time spent in direct consultation with patient on phone: 9 minutes  Saralyn Pilar, DO [facility] 04/29/2019, 3:38 PM

## 2019-04-29 NOTE — Patient Instructions (Addendum)
Thank you for coming to the office today.  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 - This is a type of tendonitis involving the tendons of the wrist, it is a very common spot for inflammation and pain, usually caused by repetitive activities (lifting, writing, typing, carrying, worse with heavier objective or more repetitive strain) - Once it is flared up it can continue to persist for days to weeks due to inflammation, and mostly importantly needs rest and time to heal - Recommend trial of Anti-inflammatory topical Diclofenac up to 3 times dailyi a sneeded - DO NOT TAKE any ibuprofen, aleve, motrin while you are taking this medicine - It is safe to take Tylenol Ext Str 500mg  tabs - take 1 to 2 (max dose 1000mg ) every 6 hours as needed for breakthrough pain, max 24 hour daily dose is 6 to 8 tablets or 4000mg  - Wrist splint will provide support to the tendons and reduce strain - Purchase an Ulnar Wrist SPlint --- OR THUMB SPICA SPLINT (to limit thumb and wrist flexion) - REST is extremely important, the goal is to AVOID re-injury, try to modify activities - Also may try ice packs if swelling, or topical icy hot muscle rub can also help ease worsening pain temporarily  If you get significant worsening pain, weakness in your hand (not due to pain), numbness, tingling, burning, more constant without activity, then follow-up sooner as we may need to consider stronger anti-inflammatory with prednisone, or referral for injection.   Please schedule a Follow-up Appointment to: Return in about 4 weeks (around 05/27/2019), or if symptoms worsen or fail to improve, for left wrist pain, tendonitis.  If you have any other questions or concerns, please feel free to call the office or send a message through MyChart. You may also schedule an earlier appointment if necessary.  Additionally, you may be receiving a survey about your experience at our office within a few days to 1 week by e-mail or  mail. We value your feedback.  , DO [facility], 07/25/2019

## 2019-04-30 ENCOUNTER — Encounter: Payer: Medicaid Other | Attending: Cardiology | Admitting: *Deleted

## 2019-04-30 DIAGNOSIS — I110 Hypertensive heart disease with heart failure: Secondary | ICD-10-CM | POA: Insufficient documentation

## 2019-04-30 DIAGNOSIS — Z95810 Presence of automatic (implantable) cardiac defibrillator: Secondary | ICD-10-CM | POA: Diagnosis not present

## 2019-04-30 DIAGNOSIS — I5022 Chronic systolic (congestive) heart failure: Secondary | ICD-10-CM | POA: Diagnosis not present

## 2019-04-30 DIAGNOSIS — Z7901 Long term (current) use of anticoagulants: Secondary | ICD-10-CM | POA: Insufficient documentation

## 2019-04-30 DIAGNOSIS — I4891 Unspecified atrial fibrillation: Secondary | ICD-10-CM | POA: Diagnosis not present

## 2019-04-30 DIAGNOSIS — E669 Obesity, unspecified: Secondary | ICD-10-CM | POA: Diagnosis not present

## 2019-04-30 DIAGNOSIS — I420 Dilated cardiomyopathy: Secondary | ICD-10-CM

## 2019-04-30 DIAGNOSIS — Z79899 Other long term (current) drug therapy: Secondary | ICD-10-CM | POA: Diagnosis not present

## 2019-04-30 DIAGNOSIS — I472 Ventricular tachycardia: Secondary | ICD-10-CM | POA: Diagnosis not present

## 2019-04-30 NOTE — Progress Notes (Signed)
Daily Session Note  Patient Details  Name: Gary Davila MRN: [id number] Date of Birth: 01-16-1991 Referring Provider:     Cardiac Rehab from 03/10/2019 in [facility] Cardiac and Pulmonary Rehab  Referring Provider  [place]      Encounter Date: 04/30/2019  Check In: Session Check In - 04/30/19 1345      Check-In   Supervising physician immediately available to respond to emergencies  See telemetry face sheet for immediately available ER MD    Location  [facility]-Cardiac & Pulmonary Rehab    Staff Present  Renita Papa, RN Vickki Hearing, BA, ACSM CEP, Exercise Physiologist;Joseph Tessie Fass RCP,RRT,BSRT    Virtual Visit  No    Medication changes reported      No    Fall or balance concerns reported     No    Warm-up and Cool-down  Performed on first and last piece of equipment    Resistance Training Performed  Yes    VAD Patient?  No    PAD/SET Patient?  No      Pain Assessment   Currently in Pain?  No/denies          Social History   Tobacco Use  Smoking Status Never Smoker  Smokeless Tobacco Never Used    Goals Met:  Independence with exercise equipment Exercise tolerated well No report of cardiac concerns or symptoms Strength training completed today  Goals Unmet:  Not Applicable  Comments: Pt able to follow exercise prescription today without complaint.  Will continue to monitor for progression.    Dr. Emily Filbert is Medical Director for [place] and [facility].

## 2019-05-14 ENCOUNTER — Encounter: Payer: Medicaid Other | Admitting: *Deleted

## 2019-05-14 ENCOUNTER — Other Ambulatory Visit: Payer: Self-pay

## 2019-05-14 DIAGNOSIS — I110 Hypertensive heart disease with heart failure: Secondary | ICD-10-CM | POA: Diagnosis not present

## 2019-05-14 DIAGNOSIS — I420 Dilated cardiomyopathy: Secondary | ICD-10-CM

## 2019-05-14 DIAGNOSIS — I5022 Chronic systolic (congestive) heart failure: Secondary | ICD-10-CM

## 2019-05-14 NOTE — Progress Notes (Signed)
Daily Session Note  Patient Details  Name: Gary Davila MRN: [id number] Date of Birth: [birth year] Referring Provider:     Cardiac Rehab from 03/10/2019 in [facility] Cardiac and Pulmonary Rehab  Referring Provider  [place]      Encounter Date: 05/14/2019  Check In: Session Check In - 05/14/19 1350      Check-In   Supervising physician immediately available to respond to emergencies  See telemetry face sheet for immediately available ER MD    Location  [facility]-Cardiac & Pulmonary Rehab    Staff Present  Nada Maclachlan, BA, ACSM CEP, Exercise Physiologist;Jessica Staunton, MA, RCEP, CCRP, CCET;Naomee [place]land Sherryll Burger, RN BSN;Joseph [place]    Virtual Visit  No    Medication changes reported      No    Fall or balance concerns reported     No    Warm-up and Cool-down  Performed on first and last piece of equipment    Resistance Training Performed  Yes    VAD Patient?  No    PAD/SET Patient?  No      Pain Assessment   Currently in Pain?  No/denies          Social History   Tobacco Use  Smoking Status Never Smoker  Smokeless Tobacco Never Used    Goals Met:  Independence with exercise equipment Exercise tolerated well No report of cardiac concerns or symptoms Strength training completed today  Goals Unmet:  Not Applicable  Comments: Pt able to follow exercise prescription today without complaint.  Will continue to monitor for progression.    Dr. Emily Filbert is Medical Director for [place] and [facility].

## 2019-05-18 ENCOUNTER — Encounter: Payer: Medicaid Other | Admitting: *Deleted

## 2019-05-18 ENCOUNTER — Ambulatory Visit: Payer: Medicaid Other | Admitting: Podiatry

## 2019-05-18 ENCOUNTER — Other Ambulatory Visit: Payer: Self-pay

## 2019-05-18 DIAGNOSIS — I5022 Chronic systolic (congestive) heart failure: Secondary | ICD-10-CM

## 2019-05-18 DIAGNOSIS — I420 Dilated cardiomyopathy: Secondary | ICD-10-CM

## 2019-05-18 DIAGNOSIS — I110 Hypertensive heart disease with heart failure: Secondary | ICD-10-CM | POA: Diagnosis not present

## 2019-05-18 NOTE — Progress Notes (Signed)
Daily Session Note  Patient Details  Name: Gary Davila MRN: [id number] Date of Birth: 1991/01/04 Referring Provider:     Cardiac Rehab from 03/10/2019 in [facility] Cardiac and Pulmonary Rehab  Referring Provider  [place]      Encounter Date: 05/18/2019  Check In: Session Check In - 05/18/19 1330      Check-In   Supervising physician immediately available to respond to emergencies  See telemetry face sheet for immediately available ER MD    Location  [facility]-Cardiac & Pulmonary Rehab    Staff Present  Renita Papa, RN BSN;Joseph [address] [place], [place], ACSM CEP, Exercise Physiologist;Amanda Oletta Darter, [place], ACSM CEP, Exercise Physiologist    Virtual Visit  No    Medication changes reported      No    Fall or balance concerns reported     No    Warm-up and Cool-down  Performed on first and last piece of equipment    Resistance Training Performed  Yes    VAD Patient?  No    PAD/SET Patient?  No      Pain Assessment   Currently in Pain?  No/denies          Social History   Tobacco Use  Smoking Status Never Smoker  Smokeless Tobacco Never Used    Goals Met:  Independence with exercise equipment Exercise tolerated well No report of cardiac concerns or symptoms Strength training completed today  Goals Unmet:  Not Applicable  Comments: Pt able to follow exercise prescription today without complaint.  Will continue to monitor for progression.    Dr. Emily Filbert is Medical Director for [place] and [facility].

## 2019-05-20 ENCOUNTER — Encounter: Payer: Self-pay | Admitting: *Deleted

## 2019-05-20 DIAGNOSIS — I420 Dilated cardiomyopathy: Secondary | ICD-10-CM

## 2019-05-20 DIAGNOSIS — I5022 Chronic systolic (congestive) heart failure: Secondary | ICD-10-CM

## 2019-05-20 NOTE — Progress Notes (Signed)
Cardiac Individual Treatment Plan  Patient Details  Name: CHANLER MENDONCA MRN: [id number] Date of Birth: 23-Jun-1990 Referring Provider:     Cardiac Rehab from 03/10/2019 in [facility] Cardiac and Pulmonary Rehab  Referring Provider  Devore      Initial Encounter Date:    Cardiac Rehab from 03/10/2019 in [facility] Cardiac and Pulmonary Rehab  Date  03/10/19      Visit Diagnosis: Heart failure, chronic systolic ([place])  Dilated cardiomyopathy ([place])  Patient's Home Medications on Admission:  Current Outpatient Medications:  .  carvedilol (COREG) 25 MG tablet, TAKE (1) TABLET TWICE A DAY., Disp: , Rfl:  .  diclofenac Sodium (VOLTAREN) 1 % GEL, Apply 2 g topically 3 (three) times daily as needed., Disp: 100 g, Rfl: 1 .  digoxin (LANOXIN) 0.125 MG tablet, TAKE 1 TABLET DAILY., Disp: , Rfl:  .  empagliflozin (JARDIANCE) 10 MG TABS tablet, Take by mouth., Disp: , Rfl:  .  eplerenone (INSPRA) 25 MG tablet, TAKE 1 TABLET DAILY., Disp: , Rfl:  .  furosemide (LASIX) 20 MG tablet, Take 40 mg by mouth 2 (two) times daily., Disp: , Rfl:  .  ibuprofen (ADVIL,MOTRIN) 200 MG tablet, Take by mouth., Disp: , Rfl:  .  sacubitril-valsartan (ENTRESTO) 24-26 MG, Take by mouth., Disp: , Rfl:  .  warfarin (COUMADIN) 5 MG tablet, Take 7.18m (one and half pill of 549m on Tues / Thurs, then 102m63mvery other day., Disp: , Rfl:   Past Medical History: Past Medical History:  Diagnosis Date  . A-fib ([facility] . Chronic CHF (congestive heart failure) ([place] . Coarctation of aorta   . Dilated cardiomyopathy [place] . Hypertension   . ICD (implantable cardioverter-defibrillator) in place   . ICD (implantable cardioverter-defibrillator) lead failure   . 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 Left ventricular diastolic dysfunction, NYHA class 2   . Obesity   . Ventricular tachycardia (HCC)     Tobacco Use: Social History   Tobacco Use  Smoking Status Never Smoker  Smokeless Tobacco Never Used    Labs: Recent  Review Flowsheet Data    There is no flowsheet data to display.       Exercise Target Goals: Exercise Program Goal: Individual exercise prescription set using results from initial 6 min walk test and THRR while considering  patient's activity barriers and safety.   Exercise Prescription Goal: Initial exercise prescription builds to 30-45 minutes a day of aerobic activity, 2-3 days per week.  Home exercise guidelines will be given to patient during program as part of exercise prescription that the participant will acknowledge.  Activity Barriers & Risk Stratification: Activity Barriers & Cardiac Risk Stratification - 02/25/19 1512      Activity Barriers & Cardiac Risk Stratification   Activity Barriers  Deconditioning;Balance Concerns;Assistive Device;Muscular Weakness;Shortness of Breath;Other (comment);History of Falls    Comments  problems with knees and ankles from possible nueropathy    Cardiac Risk Stratification  High       6 Minute Walk: 6 Minute Walk    Row Name 03/10/19 1653         6 Minute Walk   Distance  430 feet     Walk Time  6 minutes     # of Rest Breaks  0     MPH  0.8     METS  3.04     RPE  11     Perceived Dyspnea   0  VO2 Peak  10.65     Symptoms  Yes (comment)     Comments  foot pain - plantar fascitis 3/10     Resting HR  50 bpm     Resting BP  94/52     Resting Oxygen Saturation   99 %     Exercise Oxygen Saturation  during 6 min walk  97 %     Max Ex. HR  84 bpm     Max Ex. BP  94/54        Oxygen Initial Assessment:   Oxygen Re-Evaluation:   Oxygen Discharge (Final Oxygen Re-Evaluation):   Initial Exercise Prescription: Initial Exercise Prescription - 03/10/19 1700      Date of Initial Exercise RX and Referring Provider   Date  03/10/19    Referring Provider  Devore      Treadmill   MPH  0.5    Grade  0    Minutes  15      Recumbant Bike   Level  1    RPM  60    Minutes  15    METs  3      NuStep   Level  2     SPM  80    Minutes  15    METs  3      Arm Ergometer   Level  1    RPM  25    Minutes  15    METs  3      REL-XR   Level  2    Speed  50    Minutes  15    METs  3      T5 Nustep   Level  1    SPM  80    Minutes  15    METs  3      Biostep-RELP   Level  2    SPM  50    Minutes  15    METs  3      Prescription Details   Frequency (times per week)  3    Duration  Progress to 30 minutes of continuous aerobic without signs/symptoms of physical distress      Intensity   THRR 40-80% of Max Heartrate  106-162    Ratings of Perceived Exertion  11-13    Perceived Dyspnea  0-4      Resistance Training   Training Prescription  Yes    Weight  3 lb    Reps  10-15       Perform Capillary Blood Glucose checks as needed.  Exercise Prescription Changes: Exercise Prescription Changes    Row Name 03/10/19 1700 04/02/19 1400 04/13/19 1500 04/15/19 1200 04/30/19 1500     Response to Exercise   Blood Pressure (Admit)  94/52  94/50  --  88/62  98/58   Blood Pressure (Exercise)  94/54  92/66  --  102/62  100/62   Blood Pressure (Exit)  --  94/60  --  104/54  98/60   Heart Rate (Admit)  50 bpm  66 bpm  --  79 bpm  64 bpm   Heart Rate (Exercise)  84 bpm  95 bpm  --  96 bpm  96 bpm   Heart Rate (Exit)  72 bpm  73 bpm  --  76 bpm  61 bpm   Oxygen Saturation (Admit)  99 %  --  --  --  --   Oxygen Saturation (Exercise)  97 %  --  --  --  --  Rating of Perceived Exertion (Exercise)  11  11  --  13  11   Perceived Dyspnea (Exercise)  0  --  --  --  --   Symptoms  leg/foot  pain  plantar fascitis  --  --  none  none   Duration  --  --  --  Continue with 30 min of aerobic exercise without signs/symptoms of physical distress.  Continue with 30 min of aerobic exercise without signs/symptoms of physical distress.   Intensity  --  --  --  THRR unchanged  THRR unchanged     Progression   Progression  --  Continue to progress workloads to maintain intensity without signs/symptoms of  physical distress.  --  Continue to progress workloads to maintain intensity without signs/symptoms of physical distress.  Continue to progress workloads to maintain intensity without signs/symptoms of physical distress.   Average METs  --  1.8  --  1.63  1.4     Resistance Training   Training Prescription  --  Yes  --  Yes  Yes   Weight  --  3 lb  --  3 lb  3 lb   Reps  --  10-15  --  10-15  --     Interval Training   Interval Training  --  No  --  No  No     Treadmill   MPH  --  0.5  --  --  --   Grade  --  0  --  --  --   Minutes  --  15  --  --  --     NuStep   Level  --  2  --  3  4   SPM  --  80  --  --  80   Minutes  --  15  --  15  15   METs  --  2.1  --  2  1.2     Arm Ergometer   Level  --  1  --  1  --   RPM  --  25  --  --  --   Minutes  --  15  --  15  --   METs  --  1.7  --  1.5  --     REL-XR   Level  --  --  --  1  1   Speed  --  --  --  --  50   Minutes  --  --  --  15  15   METs  --  --  --  1.4  1.1     Biostep-RELP   Level  --  2  --  --  --   SPM  --  50  --  --  --   Minutes  --  15  --  --  --   METs  --  2  --  --  --     Home Exercise Plan   Plans to continue exercise at  --  --  Home (comment) walking, staff videos  Home (comment) walking, staff videos  --   Frequency  --  --  Add 3 additional days to program exercise sessions.  Add 3 additional days to program exercise sessions.  --   Initial Home Exercises Provided  --  --  04/13/19  04/13/19  --   [place] Name 05/14/19 1400             Response  to Exercise   Blood Pressure (Admit)  92/62       Blood Pressure (Exercise)  104/62       Blood Pressure (Exit)  90/54       Heart Rate (Admit)  71 bpm       Heart Rate (Exercise)  95 bpm       Heart Rate (Exit)  93 bpm       Rating of Perceived Exertion (Exercise)  11       Symptoms  none       Duration  Continue with 30 min of aerobic exercise without signs/symptoms of physical distress.       Intensity  THRR unchanged         Progression    Progression  Continue to progress workloads to maintain intensity without signs/symptoms of physical distress.       Average METs  1.6         Resistance Training   Training Prescription  Yes       Weight  3 lb       Reps  10-15         Interval Training   Interval Training  No         Home Exercise Plan   Plans to continue exercise at  Home (comment) walking, staff videos       Frequency  Add 3 additional days to program exercise sessions.       Initial Home Exercises Provided  04/13/19          Exercise Comments: Exercise Comments    Row Name 03/16/19 1536           Exercise Comments  First full day of exercise!  Patient was oriented to gym and equipment including functions, settings, policies, and procedures.  Patient's individual exercise prescription and treatment plan were reviewed.  All starting workloads were established based on the results of the 6 minute walk test done at initial orientation visit.  The plan for exercise progression was also introduced and progression will be customized based on patient's performance and goals.          Exercise Goals and Review: Exercise Goals    Row Name 03/10/19 1704             Exercise Goals   Increase Physical Activity  Yes       Intervention  Provide advice, education, support and counseling about physical activity/exercise needs.;Develop an individualized exercise prescription for aerobic and resistive training based on initial evaluation findings, risk stratification, comorbidities and participant's personal goals.       Expected Outcomes  Short Term: Attend rehab on a regular basis to increase amount of physical activity.;Long Term: Add in home exercise to make exercise part of routine and to increase amount of physical activity.;Long Term: Exercising regularly at least 3-5 days a week.       Increase Strength and Stamina  Yes       Intervention  Provide advice, education, support and counseling about physical  activity/exercise needs.;Develop an individualized exercise prescription for aerobic and resistive training based on initial evaluation findings, risk stratification, comorbidities and participant's personal goals.       Expected Outcomes  Short Term: Increase workloads from initial exercise prescription for resistance, speed, and METs.;Short Term: Perform resistance training exercises routinely during rehab and add in resistance training at home;Long Term: Improve cardiorespiratory fitness, muscular endurance and strength as measured by increased METs and functional capacity (6MWT)  Able to understand and use rate of perceived exertion (RPE) scale  Yes       Intervention  Provide education and explanation on how to use RPE scale       Expected Outcomes  Short Term: Able to use RPE daily in rehab to express subjective intensity level;Long Term:  Able to use RPE to guide intensity level when exercising independently       Knowledge and understanding of Target Heart Rate Range (THRR)  Yes       Intervention  Provide education and explanation of THRR including how the numbers were predicted and where they are located for reference       Expected Outcomes  Short Term: Able to state/look up THRR;Short Term: Able to use daily as guideline for intensity in rehab;Long Term: Able to use THRR to govern intensity when exercising independently       Understanding of Exercise Prescription  Yes       Intervention  Provide education, explanation, and written materials on patient's individual exercise prescription       Expected Outcomes  Short Term: Able to explain program exercise prescription;Long Term: Able to explain home exercise prescription to exercise independently          Exercise Goals Re-Evaluation : Exercise Goals Re-Evaluation    Row Name 03/16/19 1537 04/02/19 1456 04/13/19 1527 04/30/19 1528 05/14/19 1353     Exercise Goal Re-Evaluation   Exercise Goals Review  Increase Physical  Activity;Knowledge and understanding of Target Heart Rate Range (THRR);Able to check pulse independently;Understanding of Exercise Prescription;Able to understand and use rate of perceived exertion (RPE) scale  Increase Physical Activity;Increase Strength and Stamina;Able to understand and use rate of perceived exertion (RPE) scale;Able to understand and use Dyspnea scale;Knowledge and understanding of Target Heart Rate Range (THRR);Understanding of Exercise Prescription  Increase Physical Activity;Increase Strength and Stamina;Able to understand and use rate of perceived exertion (RPE) scale;Able to understand and use Dyspnea scale;Knowledge and understanding of Target Heart Rate Range (THRR);Understanding of Exercise Prescription;Able to check pulse independently  Increase Physical Activity;Increase Strength and Stamina;Able to understand and use rate of perceived exertion (RPE) scale;Able to understand and use Dyspnea scale;Knowledge and understanding of Target Heart Rate Range (THRR);Understanding of Exercise Prescription  Increase Physical Activity;Increase Strength and Stamina;Understanding of Exercise Prescription   Comments  Reviewed RPE scale, THR and program prescription with pt today.  Pt voiced understanding and was given a copy of goals to take home.  [name] is doing well in rehab.  he has had some ankle pain but continues to attend and try all machines.  Staff will monitor progess.  [name] is doing well in rehab.  He has started to be able to move more at home.  He has no real routine going.  Reviewed home exercise with pt today.  Pt plans to walk and use staff videos at home for exercise.  Reviewed THR, pulse, RPE, sign and symptoms, and when to call 911 or MD.  Also discussed weather considerations and indoor options.  Pt voiced understanding.  Matt has increased to 4 lb weight with one hand - other wrist is sprained and he is using 2 lb wieght until it feels better.  He did get new shoes and says he  hasnt ad any new foot pain.  [name] is doing well.  He does his daily stretches and walks some with his mom.  He has been able to be more active around the house.  We talked about  walking every day at home.  We talked about a goal of walking down here.  Overall, stamina is improving.   Expected Outcomes  Short: Use RPE daily to regulate intensity. Long: Follow program prescription in THR.  Short - conitnue to attend consistently Long - improve stamina  Short: Start to add in exercise at home.   Long: Continue to improve stamina.  Short - exercise consistently on his own Long : improve overall stamina  Short: Walk more at home and starting walking to class.  Long:  Continue to improve stamina.      Discharge Exercise Prescription (Final Exercise Prescription Changes): Exercise Prescription Changes - 05/14/19 1400      Response to Exercise   Blood Pressure (Admit)  92/62    Blood Pressure (Exercise)  104/62    Blood Pressure (Exit)  90/54    Heart Rate (Admit)  71 bpm    Heart Rate (Exercise)  95 bpm    Heart Rate (Exit)  93 bpm    Rating of Perceived Exertion (Exercise)  11    Symptoms  none    Duration  Continue with 30 min of aerobic exercise without signs/symptoms of physical distress.    Intensity  THRR unchanged      Progression   Progression  Continue to progress workloads to maintain intensity without signs/symptoms of physical distress.    Average METs  1.6      Resistance Training   Training Prescription  Yes    Weight  3 lb    Reps  10-15      Interval Training   Interval Training  No      Home Exercise Plan   Plans to continue exercise at  Home (comment)   walking, staff videos   Frequency  Add 3 additional days to program exercise sessions.    Initial Home Exercises Provided  04/13/19       Nutrition:  Target Goals: Understanding of nutrition guidelines, daily intake of sodium '1500mg'$ , cholesterol '200mg'$ , calories 30% from fat and 7% or less from saturated fats,  daily to have 5 or more servings of fruits and vegetables.  Biometrics:    Nutrition Therapy Plan and Nutrition Goals: Nutrition Therapy & Goals - 04/02/19 1102      Nutrition Therapy   Diet  Low Na, HH diet    Drug/Food Interactions  Coumadin/Vit K    Protein (specify units)  75g    Fiber  30 grams    Whole Grain Foods  3 servings    Saturated Fats  12 max. grams    Fruits and Vegetables  5 servings/day    Sodium  1.5 grams      Personal Nutrition Goals   Nutrition Goal  ST: switch out one glass of sugar-sweetened beverage with water LT: increase strength and muscle mass    Comments  B: bowl of cereal cheerios, cinnamon toast crunch, rice krispies (whole milk). L: chicken with vegetables sometimes D: varies (meat, starch, vegetable) - red meat or poultry. Drinks: sweet tea or soda. 2-3 glasses/can. Discussed HH eating and MNT for muscle building.      Intervention Plan   Intervention  Prescribe, educate and counsel regarding individualized specific dietary modifications aiming towards targeted core components such as weight, hypertension, lipid management, diabetes, heart failure and other comorbidities.;Nutrition handout(s) given to patient.    Expected Outcomes  Short Term Goal: Understand basic principles of dietary content, such as calories, fat, sodium, cholesterol and nutrients.;Short Term Goal:  A plan has been developed with personal nutrition goals set during dietitian appointment.;Long Term Goal: Adherence to prescribed nutrition plan.       Nutrition Assessments:   Nutrition Goals Re-Evaluation: Nutrition Goals Re-Evaluation    [employer] Name 04/30/19 1346             Goals   Nutrition Goal  ST: switch out one glass of sugar-sweetened beverage with water LT: increase strength and muscle mass       Comment  Pt reports thinks his strength is coming back.          Nutrition Goals Discharge (Final Nutrition Goals Re-Evaluation): Nutrition Goals Re-Evaluation - 04/30/19  1346      Goals   Nutrition Goal  ST: switch out one glass of sugar-sweetened beverage with water LT: increase strength and muscle mass    Comment  Pt reports thinks his strength is coming back.       Psychosocial: Target Goals: Acknowledge presence or absence of significant depression and/or stress, maximize coping skills, provide positive support system. Participant is able to verbalize types and ability to use techniques and skills needed for reducing stress and depression.   Initial Review & Psychosocial Screening: Initial Psych Review & Screening - 02/25/19 1514      Initial Review   Current issues with  Current Stress Concerns    Source of Stress Concerns  Chronic Illness    Comments  Been dealing with heart issues since birth, first noted in 2005      [facility]?  Yes   mom, aunt   Concerns  Recent loss of significant other    Comments  Recently lost father in August      Barriers   Psychosocial barriers to participate in program  The patient should benefit from training in stress management and relaxation.;Psychosocial barriers identified (see note)      Screening Interventions   Interventions  Encouraged to exercise;To provide support and resources with identified psychosocial needs;Provide feedback about the scores to participant    Expected Outcomes  Short Term goal: Utilizing psychosocial counselor, staff and physician to assist with identification of specific Stressors or current issues interfering with healing process. Setting desired goal for each stressor or current issue identified.;Long Term Goal: Stressors or current issues are controlled or eliminated.;Short Term goal: Identification and review with participant of any Quality of Life or Depression concerns found by scoring the questionnaire.;Long Term goal: The participant improves quality of Life and PHQ9 Scores as seen by post scores and/or verbalization of changes       Quality of  Life Scores:  Quality of Life - 03/10/19 1706      Quality of Life   Select  Quality of Life      Quality of Life Scores   Health/Function Pre  20.43 %    Socioeconomic Pre  23.29 %    Psych/Spiritual Pre  21.83 %    Family Pre  26.67 %    GLOBAL Pre  21.95 %      Scores of 19 and below usually indicate a poorer quality of life in these areas.  A difference of  2-3 points is a clinically meaningful difference.  A difference of 2-3 points in the total score of the Quality of Life Index has been associated with significant improvement in overall quality of life, self-image, physical symptoms, and general health in studies assessing change in quality of life.  PHQ-9: Recent Review Flowsheet  Data    Depression screen [facility] 2/9 04/29/2019 03/10/2019 03/04/2019   Decreased Interest 0 0 0   Down, Depressed, Hopeless 0 0 0   PHQ - 2 Score 0 0 0   Altered sleeping - 0 -   Tired, decreased energy - 1 -   Change in appetite - 0 -   Feeling bad or failure about yourself  - 0 -   Trouble concentrating - 0 -   Moving slowly or fidgety/restless - 3 -   Suicidal thoughts - 0 -   PHQ-9 Score - 4 -   Difficult doing work/chores - Somewhat difficult -     Interpretation of Total Score  Total Score Depression Severity:  1-4 = Minimal depression, 5-9 = Mild depression, 10-14 = Moderate depression, 15-19 = Moderately severe depression, 20-27 = Severe depression   Psychosocial Evaluation and Intervention:   Psychosocial Re-Evaluation: Psychosocial Re-Evaluation    Row Name 04/13/19 1530 05/14/19 1355           Psychosocial Re-Evaluation   Current issues with  Current Depression;Current Stress Concerns  Current Depression;Current Stress Concerns      Comments  Matt's family is very involved in his life.  His aunt has started to come to class with him, but we will try to keep them separated to let him have some time to himself.  She tried to get him to get a cortizone shot for his wrist he  injured.  He is starting to want some more independence and has been more active at home.  Overall, he is feeling better overall.  [name] is doing better overall.  He is still being pulled around by his aunt and she is causing some tension.  He is also trying  to watch out for his mom as well. He denies depression or anxiety symptoms.  He is already planning to join gym at [employer].      Expected Outcomes  Short: Continue to attend class to build indepence  Long: Continue stay positive.  Short: Continue to attend class to build indepence  Long: Continue stay positive.      Interventions  Encouraged to attend Cardiac Rehabilitation for the exercise  Encouraged to attend Cardiac Rehabilitation for the exercise      Continue Psychosocial Services   Follow up required by staff  --      Comments  Been dealing with heart issues since birth, first noted in 2005  --        Initial Review   Source of Stress Concerns  Chronic Illness  --         Psychosocial Discharge (Final Psychosocial Re-Evaluation): Psychosocial Re-Evaluation - 05/14/19 1355      Psychosocial Re-Evaluation   Current issues with  Current Depression;Current Stress Concerns    Comments  [name] is doing better overall.  He is still being pulled around by his aunt and she is causing some tension.  He is also trying  to watch out for his mom as well. He denies depression or anxiety symptoms.  He is already planning to join gym at [employer].    Expected Outcomes  Short: Continue to attend class to build indepence  Long: Continue stay positive.    Interventions  Encouraged to attend Cardiac Rehabilitation for the exercise       Vocational Rehabilitation: Provide vocational rehab assistance to qualifying candidates.   Vocational Rehab Evaluation & Intervention: Vocational Rehab - 02/25/19 1516      Initial  Vocational Rehab Evaluation & Intervention   Assessment shows need for Vocational Rehabilitation  No        Education: Education Goals: Education classes will be provided on a variety of topics geared toward better understanding of heart health and risk factor modification. Participant will state understanding/return demonstration of topics presented as noted by education test scores.  Learning Barriers/Preferences: Learning Barriers/Preferences - 02/25/19 1513      Learning Barriers/Preferences   Learning Barriers  None    Learning Preferences  Pictoral       Education Topics:  AED/CPR: - Group verbal and written instruction with the use of models to demonstrate the basic use of the AED with the basic ABC's of resuscitation.   General Nutrition Guidelines/Fats and Fiber: -Group instruction provided by verbal, written material, models and posters to present the general guidelines for heart healthy nutrition. Gives an explanation and review of dietary fats and fiber.   Controlling Sodium/Reading Food Labels: -Group verbal and written material supporting the discussion of sodium use in heart healthy nutrition. Review and explanation with models, verbal and written materials for utilization of the food label.   Exercise Physiology & General Exercise Guidelines: - Group verbal and written instruction with models to review the exercise physiology of the cardiovascular system and associated critical values. Provides general exercise guidelines with specific guidelines to those with heart or lung disease.    Aerobic Exercise & Resistance Training: - Gives group verbal and written instruction on the various components of exercise. Focuses on aerobic and resistive training programs and the benefits of this training and how to safely progress through these programs..   Flexibility, Balance, Mind/Body Relaxation: Provides group verbal/written instruction on the benefits of flexibility and balance training, including mind/body exercise modes such as yoga, pilates and tai chi.  Demonstration  and skill practice provided.   Cardiac Rehab from 03/26/2019 in [facility] Cardiac and Pulmonary Rehab  Date  03/26/19  Educator  AS  Instruction Review Code  1- Verbalizes Understanding      Stress and Anxiety: - Provides group verbal and written instruction about the health risks of elevated stress and causes of high stress.  Discuss the correlation between heart/lung disease and anxiety and treatment options. Review healthy ways to manage with stress and anxiety.   Depression: - Provides group verbal and written instruction on the correlation between heart/lung disease and depressed mood, treatment options, and the stigmas associated with seeking treatment.   Anatomy & Physiology of the Heart: - Group verbal and written instruction and models provide basic cardiac anatomy and physiology, with the coronary electrical and arterial systems. Review of Valvular disease and Heart Failure   Cardiac Procedures: - Group verbal and written instruction to review commonly prescribed medications for heart disease. Reviews the medication, class of the drug, and side effects. Includes the steps to properly store meds and maintain the prescription regimen. (beta blockers and nitrates)   Cardiac Medications I: - Group verbal and written instruction to review commonly prescribed medications for heart disease. Reviews the medication, class of the drug, and side effects. Includes the steps to properly store meds and maintain the prescription regimen.   Cardiac Medications II: -Group verbal and written instruction to review commonly prescribed medications for heart disease. Reviews the medication, class of the drug, and side effects. (all other drug classes)    Go Sex-Intimacy & Heart Disease, Get SMART - Goal Setting: - Group verbal and written instruction through game format to discuss heart disease and  the return to sexual intimacy. Provides group verbal and written material to discuss and apply goal  setting through the application of the S.M.A.R.T. Method.   Other Matters of the Heart: - Provides group verbal, written materials and models to describe Stable Angina and Peripheral Artery. Includes description of the disease process and treatment options available to the cardiac patient.   Exercise & Equipment Safety: - Individual verbal instruction and demonstration of equipment use and safety with use of the equipment.   Cardiac Rehab from 03/26/2019 in [facility] Cardiac and Pulmonary Rehab  Date  03/10/19  Educator  AS  Instruction Review Code  1- Verbalizes Understanding      Infection Prevention: - Provides verbal and written material to individual with discussion of infection control including proper hand washing and proper equipment cleaning during exercise session.   Cardiac Rehab from 03/26/2019 in [facility] Cardiac and Pulmonary Rehab  Date  03/10/19  Educator  AS  Instruction Review Code  1- Verbalizes Understanding      Falls Prevention: - Provides verbal and written material to individual with discussion of falls prevention and safety.   Cardiac Rehab from 03/26/2019 in [facility] Cardiac and Pulmonary Rehab  Date  03/10/19  Educator  AS  Instruction Review Code  1- Verbalizes Understanding      Diabetes: - Individual verbal and written instruction to review signs/symptoms of diabetes, desired ranges of glucose level fasting, after meals and with exercise. Acknowledge that pre and post exercise glucose checks will be done for 3 sessions at entry of program.   Know Your Numbers and Risk Factors: -Group verbal and written instruction about important numbers in your health.  Discussion of what are risk factors and how they play a role in the disease process.  Review of Cholesterol, Blood Pressure, Diabetes, and BMI and the role they play in your overall health.   Sleep Hygiene: -Provides group verbal and written instruction about how sleep can affect your health.  Define sleep  hygiene, discuss sleep cycles and impact of sleep habits. Review good sleep hygiene tips.    Other: -Provides group and verbal instruction on various topics (see comments)   Knowledge Questionnaire Score:   Core Components/Risk Factors/Patient Goals at Admission: Personal Goals and Risk Factors at Admission - 03/10/19 1706      Core Components/Risk Factors/Patient Goals on Admission    Weight Management  Yes;Obesity;Weight Loss    Intervention  Weight Management: Develop a combined nutrition and exercise program designed to reach desired caloric intake, while maintaining appropriate intake of nutrient and fiber, sodium and fats, and appropriate energy expenditure required for the weight goal.;Weight Management: Provide education and appropriate resources to help participant work on and attain dietary goals.;Weight Management/Obesity: Establish reasonable short term and long term weight goals.;Obesity: Provide education and appropriate resources to help participant work on and attain dietary goals.    Admit Weight  199 lb 8 oz (90.5 kg)    Goal Weight: Short Term  195 lb (88.5 kg)    Goal Weight: Long Term  190 lb (86.2 kg)    Expected Outcomes  Short Term: Continue to assess and modify interventions until short term weight is achieved;Long Term: Adherence to nutrition and physical activity/exercise program aimed toward attainment of established weight goal;Weight Loss: Understanding of general recommendations for a balanced deficit meal plan, which promotes 1-2 lb weight loss per week and includes a negative energy balance of [phone] kcal/d;Understanding recommendations for meals to include 15-35% energy as protein, 25-35% energy  from fat, 35-60% energy from carbohydrates, less than 225m of dietary cholesterol, 20-35 gm of total fiber daily;Understanding of distribution of calorie intake throughout the day with the consumption of 4-5 meals/snacks       Core Components/Risk Factors/Patient  Goals Review:  Goals and Risk Factor Review    Row Name 04/13/19 1534 05/14/19 1358           Core Components/Risk Factors/Patient Goals Review   Personal Goals Review  Weight Management/Obesity;Heart Failure;Hypertension  Weight Management/Obesity;Heart Failure;Hypertension      Review  When Matt first started, he lost a lot of weight very quickly.  It has now since stabilized and he is doing well now.  His pressures continue to do well.  He has not had symptoms of heart failure. He injured his wrist and it is swollen some. He is using ice and heat to treat  MCatalina Antiguais doing well with his weight staying steady.  He has not had any heart failure symptoms. He is doing well on his medications. His pressures have continued to do well.      Expected Outcomes  Short: Continue to improve strength and maintain weight.  Long: Continue to manage heart failure.  Short: Continue to maintain weight.  Long: Continue to manage heart failure.         Core Components/Risk Factors/Patient Goals at Discharge (Final Review):  Goals and Risk Factor Review - 05/14/19 1358      Core Components/Risk Factors/Patient Goals Review   Personal Goals Review  Weight Management/Obesity;Heart Failure;Hypertension    Review  MCatalina Antiguais doing well with his weight staying steady.  He has not had any heart failure symptoms. He is doing well on his medications. His pressures have continued to do well.    Expected Outcomes  Short: Continue to maintain weight.  Long: Continue to manage heart failure.       ITP Comments: ITP Comments    Row Name 02/25/19 1531 03/16/19 1536 03/16/19 1551 03/25/19 1059 04/02/19 1132   ITP Comments  Virtual Orientation Completed.  Documentation for diagnosis can be found [place].  EP eval scheduled for 11/17 at 330pm  First full day of exercise!  Patient was oriented to gym and equipment including functions, settings, policies, and procedures.  Patient's individual exercise  prescription and treatment plan were reviewed.  All starting workloads were established based on the results of the 6 minute walk test done at initial orientation visit.  The plan for exercise progression was also introduced and progression will be customized based on patient's performance and goals.  Saw podiatrist, diagnosed with Plantar Fasciitis. Received cortisone shots and pain has been relieved.  30 day review competed . ITP sent to Dr MEmily Filbertfor review, changes as needed and ITP approval signature.  Completed Initial RD Eval   Row Name 04/22/19 0925 05/20/19 1507         ITP Comments  30 day review competed . ITP sent to Dr MEmily Filbertfor review, changes as needed and ITP approval signature  30 day review completed. ITP sent to Dr. MEmily Filbert Medical Director of Cardiac and Pulmonary Rehab. Continue with ITP unless changes are made by physician.  Department operating under reduced schedule until further notice by request from hospital leadership.         Comments: 30 day review

## 2019-05-28 ENCOUNTER — Other Ambulatory Visit: Payer: Self-pay

## 2019-05-28 ENCOUNTER — Encounter: Payer: Medicaid Other | Attending: Cardiology | Admitting: *Deleted

## 2019-05-28 DIAGNOSIS — I420 Dilated cardiomyopathy: Secondary | ICD-10-CM | POA: Insufficient documentation

## 2019-05-28 DIAGNOSIS — I5022 Chronic systolic (congestive) heart failure: Secondary | ICD-10-CM | POA: Insufficient documentation

## 2019-05-28 DIAGNOSIS — Z7901 Long term (current) use of anticoagulants: Secondary | ICD-10-CM | POA: Diagnosis not present

## 2019-05-28 DIAGNOSIS — Z95810 Presence of automatic (implantable) cardiac defibrillator: Secondary | ICD-10-CM | POA: Insufficient documentation

## 2019-05-28 DIAGNOSIS — E669 Obesity, unspecified: Secondary | ICD-10-CM | POA: Diagnosis not present

## 2019-05-28 DIAGNOSIS — Z79899 Other long term (current) drug therapy: Secondary | ICD-10-CM | POA: Diagnosis not present

## 2019-05-28 DIAGNOSIS — I110 Hypertensive heart disease with heart failure: Secondary | ICD-10-CM | POA: Diagnosis present

## 2019-05-28 DIAGNOSIS — I4891 Unspecified atrial fibrillation: Secondary | ICD-10-CM | POA: Diagnosis not present

## 2019-05-28 DIAGNOSIS — I472 Ventricular tachycardia: Secondary | ICD-10-CM | POA: Diagnosis not present

## 2019-05-28 NOTE — Progress Notes (Signed)
Daily Session Note  Patient Details  Name: RYLON POITRA MRN: [id number] Date of Birth: February 08, 1991 Referring Provider:     Cardiac Rehab from 03/10/2019 in [facility] Cardiac and Pulmonary Rehab  Referring Provider  Devore      Encounter Date: 05/28/2019  Check In: Session Check In - 05/28/19 1331      Check-In   Supervising physician immediately available to respond to emergencies  See telemetry face sheet for immediately available ER MD    Location  [facility]-Cardiac & Pulmonary Rehab    Staff Present  Heath Lark, RN, BSN, CCRP;Meredith Sherryll Burger, RN BSN;Joseph Foy [place], [place], ACSM CEP, Exercise Physiologist    Virtual Visit  No    Medication changes reported      No    Fall or balance concerns reported     No    Warm-up and Cool-down  Performed on first and last piece of equipment    Resistance Training Performed  Yes    VAD Patient?  No    PAD/SET Patient?  No      Pain Assessment   Currently in Pain?  No/denies          Social History   Tobacco Use  Smoking Status Never Smoker  Smokeless Tobacco Never Used    Goals Met:  Independence with exercise equipment Exercise tolerated well No report of cardiac concerns or symptoms  Goals Unmet:  Not Applicable  Comments: Pt able to follow exercise prescription today without complaint.  Will continue to monitor for progression.    Dr. Emily Filbert is Medical Director for [place] and [facility].

## 2019-06-01 ENCOUNTER — Encounter: Payer: Medicaid Other | Admitting: *Deleted

## 2019-06-01 ENCOUNTER — Other Ambulatory Visit: Payer: Self-pay

## 2019-06-01 DIAGNOSIS — I5022 Chronic systolic (congestive) heart failure: Secondary | ICD-10-CM

## 2019-06-01 DIAGNOSIS — I420 Dilated cardiomyopathy: Secondary | ICD-10-CM

## 2019-06-01 DIAGNOSIS — I110 Hypertensive heart disease with heart failure: Secondary | ICD-10-CM | POA: Diagnosis not present

## 2019-06-01 NOTE — Progress Notes (Signed)
Daily Session Note  Patient Details  Name: Gary Davila MRN: [id number] Date of Birth: [birth year] Referring Provider:     Cardiac Rehab from 03/10/2019 in [facility] Cardiac and Pulmonary Rehab  Referring Provider  Devore      Encounter Date: 06/01/2019  Check In: Session Check In - 06/01/19 1600      Check-In   Supervising physician immediately available to respond to emergencies  See telemetry face sheet for immediately available ER MD    Location  [facility]-Cardiac & Pulmonary Rehab    Staff Present  Renita Papa, RN Moises Blood, BS, ACSM CEP, Exercise Physiologist;Joseph Tessie Fass RCP,RRT,BSRT    Virtual Visit  No    Medication changes reported      No    Fall or balance concerns reported     No    Warm-up and Cool-down  Performed on first and last piece of equipment    Resistance Training Performed  Yes    VAD Patient?  No    PAD/SET Patient?  No      Pain Assessment   Currently in Pain?  No/denies          Social History   Tobacco Use  Smoking Status Never Smoker  Smokeless Tobacco Never Used    Goals Met:  Independence with exercise equipment Exercise tolerated well No report of cardiac concerns or symptoms Strength training completed today  Goals Unmet:  Not Applicable  Comments: Pt able to follow exercise prescription today without complaint.  Will continue to monitor for progression.    Dr. Emily Filbert is Medical Director for [place] and [facility].

## 2019-06-08 ENCOUNTER — Encounter: Payer: Medicaid Other | Admitting: *Deleted

## 2019-06-08 ENCOUNTER — Other Ambulatory Visit: Payer: Self-pay

## 2019-06-08 DIAGNOSIS — I420 Dilated cardiomyopathy: Secondary | ICD-10-CM

## 2019-06-08 DIAGNOSIS — I110 Hypertensive heart disease with heart failure: Secondary | ICD-10-CM | POA: Diagnosis not present

## 2019-06-08 DIAGNOSIS — I5022 Chronic systolic (congestive) heart failure: Secondary | ICD-10-CM

## 2019-06-08 NOTE — Progress Notes (Signed)
Daily Session Note  Patient Details  Name: Gary Davila MRN: [id number] Date of Birth: 08/11/1990 Referring Provider:     Cardiac Rehab from 03/10/2019 in [facility] Cardiac and Pulmonary Rehab  Referring Provider  Devore      Encounter Date: 06/08/2019  Check In: Session Check In - 06/08/19 1543      Check-In   Supervising physician immediately available to respond to emergencies  See telemetry face sheet for immediately available ER MD    Location  [facility]-Cardiac & Pulmonary Rehab    Staff Present  Renita Papa, RN Moises Blood, BS, ACSM CEP, Exercise Physiologist;Joseph Tessie Fass RCP,RRT,BSRT    Virtual Visit  No    Medication changes reported      No    Fall or balance concerns reported     No    Warm-up and Cool-down  Performed on first and last piece of equipment    Resistance Training Performed  Yes    VAD Patient?  No    PAD/SET Patient?  No      Pain Assessment   Currently in Pain?  No/denies          Social History   Tobacco Use  Smoking Status Never Smoker  Smokeless Tobacco Never Used    Goals Met:  Independence with exercise equipment Exercise tolerated well No report of cardiac concerns or symptoms Strength training completed today  Goals Unmet:  Not Applicable  Comments: Pt able to follow exercise prescription today without complaint.  Will continue to monitor for progression.    Dr. Emily Filbert is Medical Director for [place] and [facility].

## 2019-06-15 ENCOUNTER — Encounter: Payer: Medicaid Other | Admitting: *Deleted

## 2019-06-15 ENCOUNTER — Other Ambulatory Visit: Payer: Self-pay

## 2019-06-15 DIAGNOSIS — I110 Hypertensive heart disease with heart failure: Secondary | ICD-10-CM | POA: Diagnosis not present

## 2019-06-15 DIAGNOSIS — I420 Dilated cardiomyopathy: Secondary | ICD-10-CM

## 2019-06-15 DIAGNOSIS — I5022 Chronic systolic (congestive) heart failure: Secondary | ICD-10-CM

## 2019-06-15 NOTE — Progress Notes (Signed)
Daily Session Note  Patient Details  Name: Gary Davila MRN: [id number] Date of Birth: [birth year] Referring Provider:     Cardiac Rehab from 03/10/2019 in [facility] Cardiac and Pulmonary Rehab  Referring Provider  Devore      Encounter Date: 06/15/2019  Check In: Session Check In - 06/15/19 1542      Check-In   Supervising physician immediately available to respond to emergencies  See telemetry face sheet for immediately available ER MD    Location  [facility]-Cardiac & Pulmonary Rehab    Staff Present  Earlean Shawl, BS, ACSM CEP, Exercise Physiologist;Kierstin January Sherryll Burger, RN BSN;Joseph Hood RCP,RRT,BSRT    Virtual Visit  No    Medication changes reported      No    Fall or balance concerns reported     No    Warm-up and Cool-down  Performed on first and last piece of equipment    Resistance Training Performed  Yes    VAD Patient?  No    PAD/SET Patient?  No      Pain Assessment   Currently in Pain?  No/denies          Social History   Tobacco Use  Smoking Status Never Smoker  Smokeless Tobacco Never Used    Goals Met:  Independence with exercise equipment Exercise tolerated well No report of cardiac concerns or symptoms Strength training completed today  Goals Unmet:  Not Applicable  Comments: Pt able to follow exercise prescription today without complaint.  Will continue to monitor for progression.    Dr. Emily Filbert is Medical Director for [place] and [facility].

## 2019-06-17 ENCOUNTER — Encounter: Payer: Self-pay | Admitting: *Deleted

## 2019-06-17 DIAGNOSIS — I5022 Chronic systolic (congestive) heart failure: Secondary | ICD-10-CM

## 2019-06-17 DIAGNOSIS — I420 Dilated cardiomyopathy: Secondary | ICD-10-CM

## 2019-06-17 NOTE — Progress Notes (Signed)
Cardiac Individual Treatment Plan  Patient Details  Name: CHANLER MENDONCA MRN: [id number] Date of Birth: 23-Jun-1990 Referring Provider:     Cardiac Rehab from 03/10/2019 in [facility] Cardiac and Pulmonary Rehab  Referring Provider  Devore      Initial Encounter Date:    Cardiac Rehab from 03/10/2019 in [facility] Cardiac and Pulmonary Rehab  Date  03/10/19      Visit Diagnosis: Heart failure, chronic systolic ([place])  Dilated cardiomyopathy ([place])  Patient's Home Medications on Admission:  Current Outpatient Medications:  .  carvedilol (COREG) 25 MG tablet, TAKE (1) TABLET TWICE A DAY., Disp: , Rfl:  .  diclofenac Sodium (VOLTAREN) 1 % GEL, Apply 2 g topically 3 (three) times daily as needed., Disp: 100 g, Rfl: 1 .  digoxin (LANOXIN) 0.125 MG tablet, TAKE 1 TABLET DAILY., Disp: , Rfl:  .  empagliflozin (JARDIANCE) 10 MG TABS tablet, Take by mouth., Disp: , Rfl:  .  eplerenone (INSPRA) 25 MG tablet, TAKE 1 TABLET DAILY., Disp: , Rfl:  .  furosemide (LASIX) 20 MG tablet, Take 40 mg by mouth 2 (two) times daily., Disp: , Rfl:  .  ibuprofen (ADVIL,MOTRIN) 200 MG tablet, Take by mouth., Disp: , Rfl:  .  sacubitril-valsartan (ENTRESTO) 24-26 MG, Take by mouth., Disp: , Rfl:  .  warfarin (COUMADIN) 5 MG tablet, Take 7.18m (one and half pill of 549m on Tues / Thurs, then 102m63mvery other day., Disp: , Rfl:   Past Medical History: Past Medical History:  Diagnosis Date  . A-fib ([facility] . Chronic CHF (congestive heart failure) ([place] . Coarctation of aorta   . Dilated [place] . Hypertension   . ICD (implantable cardioverter-defibrillator) in place   . ICD (implantable cardioverter-defibrillator) lead failure   . 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 Left ventricular diastolic dysfunction, NYHA class 2   . Obesity   . Ventricular tachycardia (HCC)     Tobacco Use: Social History   Tobacco Use  Smoking Status Never Smoker  Smokeless Tobacco Never Used    Labs: Recent  Review Flowsheet Data    There is no flowsheet data to display.       Exercise Target Goals: Exercise Program Goal: Individual exercise prescription set using results from initial 6 min walk test and THRR while considering  patient's activity barriers and safety.   Exercise Prescription Goal: Initial exercise prescription builds to 30-45 minutes a day of aerobic activity, 2-3 days per week.  Home exercise guidelines will be given to patient during program as part of exercise prescription that the participant will acknowledge.  Activity Barriers & Risk Stratification: Activity Barriers & Cardiac Risk Stratification - 02/25/19 1512      Activity Barriers & Cardiac Risk Stratification   Activity Barriers  Deconditioning;Balance Concerns;Assistive Device;Muscular Weakness;Shortness of Breath;Other (comment);History of Falls    Comments  problems with knees and ankles from possible nueropathy    Cardiac Risk Stratification  High       6 Minute Walk: 6 Minute Walk    Row Name 03/10/19 1653         6 Minute Walk   Distance  430 feet     Walk Time  6 minutes     # of Rest Breaks  0     MPH  0.8     METS  3.04     RPE  11     Perceived Dyspnea   0  VO2 Peak  10.65     Symptoms  Yes (comment)     Comments  foot pain - plantar fascitis 3/10     Resting HR  50 bpm     Resting BP  94/52     Resting Oxygen Saturation   99 %     Exercise Oxygen Saturation  during 6 min walk  97 %     Max Ex. HR  84 bpm     Max Ex. BP  94/54        Oxygen Initial Assessment:   Oxygen Re-Evaluation:   Oxygen Discharge (Final Oxygen Re-Evaluation):   Initial Exercise Prescription: Initial Exercise Prescription - 03/10/19 1700      Date of Initial Exercise RX and Referring Provider   Date  03/10/19    Referring Provider  Devore      Treadmill   MPH  0.5    Grade  0    Minutes  15      Recumbant Bike   Level  1    RPM  60    Minutes  15    METs  3      NuStep   Level  2     SPM  80    Minutes  15    METs  3      Arm Ergometer   Level  1    RPM  25    Minutes  15    METs  3      REL-XR   Level  2    Speed  50    Minutes  15    METs  3      T5 Nustep   Level  1    SPM  80    Minutes  15    METs  3      Biostep-RELP   Level  2    SPM  50    Minutes  15    METs  3      Prescription Details   Frequency (times per week)  3    Duration  Progress to 30 minutes of continuous aerobic without signs/symptoms of physical distress      Intensity   THRR 40-80% of Max Heartrate  106-162    Ratings of Perceived Exertion  11-13    Perceived Dyspnea  0-4      Resistance Training   Training Prescription  Yes    Weight  3 lb    Reps  10-15       Perform Capillary Blood Glucose checks as needed.  Exercise Prescription Changes: Exercise Prescription Changes    Row Name 03/10/19 1700 04/02/19 1400 04/13/19 1500 04/15/19 1200 04/30/19 1500     Response to Exercise   Blood Pressure (Admit)  94/52  94/50  --  88/62  98/58   Blood Pressure (Exercise)  94/54  92/66  --  102/62  100/62   Blood Pressure (Exit)  --  94/60  --  104/54  98/60   Heart Rate (Admit)  50 bpm  66 bpm  --  79 bpm  64 bpm   Heart Rate (Exercise)  84 bpm  95 bpm  --  96 bpm  96 bpm   Heart Rate (Exit)  72 bpm  73 bpm  --  76 bpm  61 bpm   Oxygen Saturation (Admit)  99 %  --  --  --  --   Oxygen Saturation (Exercise)  97 %  --  --  --  --  Rating of Perceived Exertion (Exercise)  11  11  --  13  11   Perceived Dyspnea (Exercise)  0  --  --  --  --   Symptoms  leg/foot  pain  plantar fascitis  --  --  none  none   Duration  --  --  --  Continue with 30 min of aerobic exercise without signs/symptoms of physical distress.  Continue with 30 min of aerobic exercise without signs/symptoms of physical distress.   Intensity  --  --  --  THRR unchanged  THRR unchanged     Progression   Progression  --  Continue to progress workloads to maintain intensity without signs/symptoms of  physical distress.  --  Continue to progress workloads to maintain intensity without signs/symptoms of physical distress.  Continue to progress workloads to maintain intensity without signs/symptoms of physical distress.   Average METs  --  1.8  --  1.63  1.4     Resistance Training   Training Prescription  --  Yes  --  Yes  Yes   Weight  --  3 lb  --  3 lb  3 lb   Reps  --  10-15  --  10-15  --     Interval Training   Interval Training  --  No  --  No  No     Treadmill   MPH  --  0.5  --  --  --   Grade  --  0  --  --  --   Minutes  --  15  --  --  --     NuStep   Level  --  2  --  3  4   SPM  --  80  --  --  80   Minutes  --  15  --  15  15   METs  --  2.1  --  2  1.2     Arm Ergometer   Level  --  1  --  1  --   RPM  --  25  --  --  --   Minutes  --  15  --  15  --   METs  --  1.7  --  1.5  --     REL-XR   Level  --  --  --  1  1   Speed  --  --  --  --  50   Minutes  --  --  --  15  15   METs  --  --  --  1.4  1.1     Biostep-RELP   Level  --  2  --  --  --   SPM  --  50  --  --  --   Minutes  --  15  --  --  --   METs  --  2  --  --  --     Home Exercise Plan   Plans to continue exercise at  --  --  Home (comment) walking, staff videos  Home (comment) walking, staff videos  --   Frequency  --  --  Add 3 additional days to program exercise sessions.  Add 3 additional days to program exercise sessions.  --   Initial Home Exercises Provided  --  --  04/13/19  04/13/19  --   [place] Name 05/14/19 1400 05/29/19 0900 06/11/19 0900         Response  to Exercise   Blood Pressure (Admit)  92/62  90/56  102/62     Blood Pressure (Exercise)  104/62  110/66  98/60     Blood Pressure (Exit)  90/54  96/66  90/60     Heart Rate (Admit)  71 bpm  90 bpm  65 bpm     Heart Rate (Exercise)  95 bpm  130 bpm  99 bpm     Heart Rate (Exit)  93 bpm  104 bpm  81 bpm     Rating of Perceived Exertion (Exercise)  '11  12  12     '$ Symptoms  none  none  knee swollen this week     Duration   Continue with 30 min of aerobic exercise without signs/symptoms of physical distress.  Continue with 30 min of aerobic exercise without signs/symptoms of physical distress.  Continue with 30 min of aerobic exercise without signs/symptoms of physical distress.     Intensity  THRR unchanged  THRR unchanged  THRR unchanged       Progression   Progression  Continue to progress workloads to maintain intensity without signs/symptoms of physical distress.  Continue to progress workloads to maintain intensity without signs/symptoms of physical distress.  Continue to progress workloads to maintain intensity without signs/symptoms of physical distress.     Average METs  1.6  2  1.72       Resistance Training   Training Prescription  Yes  Yes  Yes     Weight  3 lb  3 lb  3 lb     Reps  10-15  10-15  10-15       Interval Training   Interval Training  No  No  No       Treadmill   MPH  --  1.5  1.5     Grade  --  0  0     Minutes  --  15  15     METs  --  --  2.15       Recumbant Elliptical   Level  --  --  1     Minutes  --  --  15     METs  --  --  1.2       Elliptical   Level  --  --  1     Speed  --  --  2     Minutes  --  --  4       T5 Nustep   Level  --  1  1     SPM  --  80  --     Minutes  --  15  15     METs  --  1.8  1.8       Home Exercise Plan   Plans to continue exercise at  Home (comment) walking, staff videos  Home (comment) walking, staff videos  Home (comment) walking, staff videos     Frequency  Add 3 additional days to program exercise sessions.  Add 3 additional days to program exercise sessions.  Add 3 additional days to program exercise sessions.     Initial Home Exercises Provided  04/13/19  04/13/19  04/13/19        Exercise Comments: Exercise Comments    Row Name 03/16/19 1536           Exercise Comments  First full day of exercise!  Patient was oriented to gym and equipment including functions, settings,  policies, and procedures.  Patient's individual  exercise prescription and treatment plan were reviewed.  All starting workloads were established based on the results of the 6 minute walk test done at initial orientation visit.  The plan for exercise progression was also introduced and progression will be customized based on patient's performance and goals.          Exercise Goals and Review: Exercise Goals    Row Name 03/10/19 1704             Exercise Goals   Increase Physical Activity  Yes       Intervention  Provide advice, education, support and counseling about physical activity/exercise needs.;Develop an individualized exercise prescription for aerobic and resistive training based on initial evaluation findings, risk stratification, comorbidities and participant's personal goals.       Expected Outcomes  Short Term: Attend rehab on a regular basis to increase amount of physical activity.;Long Term: Add in home exercise to make exercise part of routine and to increase amount of physical activity.;Long Term: Exercising regularly at least 3-5 days a week.       Increase Strength and Stamina  Yes       Intervention  Provide advice, education, support and counseling about physical activity/exercise needs.;Develop an individualized exercise prescription for aerobic and resistive training based on initial evaluation findings, risk stratification, comorbidities and participant's personal goals.       Expected Outcomes  Short Term: Increase workloads from initial exercise prescription for resistance, speed, and METs.;Short Term: Perform resistance training exercises routinely during rehab and add in resistance training at home;Long Term: Improve cardiorespiratory fitness, muscular endurance and strength as measured by increased METs and functional capacity (6MWT)       Able to understand and use rate of perceived exertion (RPE) scale  Yes       Intervention  Provide education and explanation on how to use RPE scale       Expected Outcomes  Short  Term: Able to use RPE daily in rehab to express subjective intensity level;Long Term:  Able to use RPE to guide intensity level when exercising independently       Knowledge and understanding of Target Heart Rate Range (THRR)  Yes       Intervention  Provide education and explanation of THRR including how the numbers were predicted and where they are located for reference       Expected Outcomes  Short Term: Able to state/look up THRR;Short Term: Able to use daily as guideline for intensity in rehab;Long Term: Able to use THRR to govern intensity when exercising independently       Understanding of Exercise Prescription  Yes       Intervention  Provide education, explanation, and written materials on patient's individual exercise prescription       Expected Outcomes  Short Term: Able to explain program exercise prescription;Long Term: Able to explain home exercise prescription to exercise independently          Exercise Goals Re-Evaluation : Exercise Goals Re-Evaluation    Row Name 03/16/19 1537 04/02/19 1456 04/13/19 1527 04/30/19 1528 05/14/19 1353     Exercise Goal Re-Evaluation   Exercise Goals Review  Increase Physical Activity;Knowledge and understanding of Target Heart Rate Range (THRR);Able to check pulse independently;Understanding of Exercise Prescription;Able to understand and use rate of perceived exertion (RPE) scale  Increase Physical Activity;Increase Strength and Stamina;Able to understand and use rate of perceived exertion (RPE) scale;Able to understand and use Dyspnea  scale;Knowledge and understanding of Target Heart Rate Range (THRR);Understanding of Exercise Prescription  Increase Physical Activity;Increase Strength and Stamina;Able to understand and use rate of perceived exertion (RPE) scale;Able to understand and use Dyspnea scale;Knowledge and understanding of Target Heart Rate Range (THRR);Understanding of Exercise Prescription;Able to check pulse independently  Increase  Physical Activity;Increase Strength and Stamina;Able to understand and use rate of perceived exertion (RPE) scale;Able to understand and use Dyspnea scale;Knowledge and understanding of Target Heart Rate Range (THRR);Understanding of Exercise Prescription  Increase Physical Activity;Increase Strength and Stamina;Understanding of Exercise Prescription   Comments  Reviewed RPE scale, THR and program prescription with pt today.  Pt voiced understanding and was given a copy of goals to take home.  [name] is doing well in rehab.  he has had some ankle pain but continues to attend and try all machines.  Staff will monitor progess.  [name] is doing well in rehab.  He has started to be able to move more at home.  He has no real routine going.  Reviewed home exercise with pt today.  Pt plans to walk and use staff videos at home for exercise.  Reviewed THR, pulse, RPE, sign and symptoms, and when to call 911 or MD.  Also discussed weather considerations and indoor options.  Pt voiced understanding.  Matt has increased to 4 lb weight with one hand - other wrist is sprained and he is using 2 lb wieght until it feels better.  He did get new shoes and says he hasnt ad any new foot pain.  [name] is doing well.  He does his daily stretches and walks some with his mom.  He has been able to be more active around the house.  We talked about walking every day at home.  We talked about a goal of walking down here.  Overall, stamina is improving.   Expected Outcomes  Short: Use RPE daily to regulate intensity. Long: Follow program prescription in THR.  Short - conitnue to attend consistently Long - improve stamina  Short: Start to add in exercise at home.   Long: Continue to improve stamina.  Short - exercise consistently on his own Long : improve overall stamina  Short: Walk more at home and starting walking to class.  Long:  Continue to improve stamina.   [place] Name 05/29/19 0233 06/11/19 0920 06/15/19 1541         Exercise Goal  Re-Evaluation   Exercise Goals Review  Increase Physical Activity;Able to understand and use rate of perceived exertion (RPE) scale;Knowledge and understanding of Target Heart Rate Range (THRR);Understanding of Exercise Prescription;Increase Strength and Stamina;Able to understand and use Dyspnea scale;Able to check pulse independently  Increase Physical Activity;Increase Strength and Stamina;Understanding of Exercise Prescription  Increase Physical Activity;Increase Strength and Stamina;Understanding of Exercise Prescription     Comments  [name] states he stamina is getting better.  He can walk up stairs at home unassisted and can stand the whole time in the shower.  He walks outside at home.  [name] has been doing better in rehab.  He has actually been able to tackle the elliptical some.  He was not able to increase this week as his knee swelled up and he was not able to walk as well.  We will continue to monitor his progress.  [name] is doing well in rehab.  He is getting in some exercise/activity by going up and down stairs and feeding dog.  His goal was to walk to and from rehab and  he is now doing it!     Expected Outcomes  Short - continue to be active at home Long : walk down to rehab gym before and after exercise  Short: Increase speed on treadmill and seated equipment.  Long: Continue to improve stamina.  Short: More activity at home.  Long: Continue to improve stamina.        Discharge Exercise Prescription (Final Exercise Prescription Changes): Exercise Prescription Changes - 06/11/19 0900      Response to Exercise   Blood Pressure (Admit)  102/62    Blood Pressure (Exercise)  98/60    Blood Pressure (Exit)  90/60    Heart Rate (Admit)  65 bpm    Heart Rate (Exercise)  99 bpm    Heart Rate (Exit)  81 bpm    Rating of Perceived Exertion (Exercise)  12    Symptoms  knee swollen this week    Duration  Continue with 30 min of aerobic exercise without signs/symptoms of physical distress.     Intensity  THRR unchanged      Progression   Progression  Continue to progress workloads to maintain intensity without signs/symptoms of physical distress.    Average METs  1.72      Resistance Training   Training Prescription  Yes    Weight  3 lb    Reps  10-15      Interval Training   Interval Training  No      Treadmill   MPH  1.5    Grade  0    Minutes  15    METs  2.15      Recumbant Elliptical   Level  1    Minutes  15    METs  1.2      Elliptical   Level  1    Speed  2    Minutes  4      T5 Nustep   Level  1    Minutes  15    METs  1.8      Home Exercise Plan   Plans to continue exercise at  Home (comment)   walking, staff videos   Frequency  Add 3 additional days to program exercise sessions.    Initial Home Exercises Provided  04/13/19       Nutrition:  Target Goals: Understanding of nutrition guidelines, daily intake of sodium '1500mg'$ , cholesterol '200mg'$ , calories 30% from fat and 7% or less from saturated fats, daily to have 5 or more servings of fruits and vegetables.  Biometrics:    Nutrition Therapy Plan and Nutrition Goals: Nutrition Therapy & Goals - 04/02/19 1102      Nutrition Therapy   Diet  Low Na, HH diet    Drug/Food Interactions  Coumadin/Vit K    Protein (specify units)  75g    Fiber  30 grams    Whole Grain Foods  3 servings    Saturated Fats  12 max. grams    Fruits and Vegetables  5 servings/day    Sodium  1.5 grams      Personal Nutrition Goals   Nutrition Goal  ST: switch out one glass of sugar-sweetened beverage with water LT: increase strength and muscle mass    Comments  B: bowl of cereal cheerios, cinnamon toast crunch, rice krispies (whole milk). L: chicken with vegetables sometimes D: varies (meat, starch, vegetable) - red meat or poultry. Drinks: sweet tea or soda. 2-3 glasses/can. Discussed HH eating and MNT for muscle  building.      Intervention Plan   Intervention  Prescribe, educate and counsel regarding  individualized specific dietary modifications aiming towards targeted core components such as weight, hypertension, lipid management, diabetes, heart failure and other comorbidities.;Nutrition handout(s) given to patient.    Expected Outcomes  Short Term Goal: Understand basic principles of dietary content, such as calories, fat, sodium, cholesterol and nutrients.;Short Term Goal: A plan has been developed with personal nutrition goals set during dietitian appointment.;Long Term Goal: Adherence to prescribed nutrition plan.       Nutrition Assessments:   Nutrition Goals Re-Evaluation: Nutrition Goals Re-Evaluation    Row Name 04/30/19 1346 06/08/19 1608           Goals   Nutrition Goal  ST: switch out one glass of sugar-sweetened beverage with water LT: increase strength and muscle mass  ST: switch out one glass of sugar-sweetened beverage with water LT: increase strength and muscle mass      Comment  Pt reports thinks his strength is coming back.  Some of the time is switching, pt reports he just needs to get used to it and will continue to work on it. Pt reports strength is ok when legs arent in pain.      Expected Outcome  --  ST: switch out one glass of sugar-sweetened beverage with water LT: increase strength and muscle mass         Nutrition Goals Discharge (Final Nutrition Goals Re-Evaluation): Nutrition Goals Re-Evaluation - 06/08/19 1608      Goals   Nutrition Goal  ST: switch out one glass of sugar-sweetened beverage with water LT: increase strength and muscle mass    Comment  Some of the time is switching, pt reports he just needs to get used to it and will continue to work on it. Pt reports strength is ok when legs arent in pain.    Expected Outcome  ST: switch out one glass of sugar-sweetened beverage with water LT: increase strength and muscle mass       Psychosocial: Target Goals: Acknowledge presence or absence of significant depression and/or stress, maximize coping  skills, provide positive support system. Participant is able to verbalize types and ability to use techniques and skills needed for reducing stress and depression.   Initial Review & Psychosocial Screening: Initial Psych Review & Screening - 02/25/19 1514      Initial Review   Current issues with  Current Stress Concerns    Source of Stress Concerns  Chronic Illness    Comments  Been dealing with heart issues since birth, first noted in 2005      [place]?  Yes   mom, aunt   Concerns  Recent loss of significant other    Comments  Recently lost father in August      Barriers   Psychosocial barriers to participate in program  The patient should benefit from training in stress management and relaxation.;Psychosocial barriers identified (see note)      Screening Interventions   Interventions  Encouraged to exercise;To provide support and resources with identified psychosocial needs;Provide feedback about the scores to participant    Expected Outcomes  Short Term goal: Utilizing psychosocial counselor, staff and physician to assist with identification of specific Stressors or current issues interfering with healing process. Setting desired goal for each stressor or current issue identified.;Long Term Goal: Stressors or current issues are controlled or eliminated.;Short Term goal: Identification and review with participant of any  Quality of Life or Depression concerns found by scoring the questionnaire.;Long Term goal: The participant improves quality of Life and PHQ9 Scores as seen by post scores and/or verbalization of changes       Quality of Life Scores:  Quality of Life - 03/10/19 1706      Quality of Life   Select  Quality of Life      Quality of Life Scores   Health/Function Pre  20.43 %    Socioeconomic Pre  23.29 %    Psych/Spiritual Pre  21.83 %    Family Pre  26.67 %    GLOBAL Pre  21.95 %      Scores of 19 and below usually indicate a poorer  quality of life in these areas.  A difference of  2-3 points is a clinically meaningful difference.  A difference of 2-3 points in the total score of the Quality of Life Index has been associated with significant improvement in overall quality of life, self-image, physical symptoms, and general health in studies assessing change in quality of life.  PHQ-9: Recent Review Flowsheet Data    Depression screen [facility] 2/9 04/29/2019 03/10/2019 03/04/2019   Decreased Interest 0 0 0   Down, Depressed, Hopeless 0 0 0   PHQ - 2 Score 0 0 0   Altered sleeping - 0 -   Tired, decreased energy - 1 -   Change in appetite - 0 -   Feeling bad or failure about yourself  - 0 -   Trouble concentrating - 0 -   Moving slowly or fidgety/restless - 3 -   Suicidal thoughts - 0 -   PHQ-9 Score - 4 -   Difficult doing work/chores - Somewhat difficult -     Interpretation of Total Score  Total Score Depression Severity:  1-4 = Minimal depression, 5-9 = Mild depression, 10-14 = Moderate depression, 15-19 = Moderately severe depression, 20-27 = Severe depression   Psychosocial Evaluation and Intervention:   Psychosocial Re-Evaluation: Psychosocial Re-Evaluation    Row Name 04/13/19 1530 05/14/19 1355 06/15/19 1543         Psychosocial Re-Evaluation   Current issues with  Current Depression;Current Stress Concerns  Current Depression;Current Stress Concerns  Current Depression;Current Stress Concerns     Comments  Matt's family is very involved in his life.  His aunt has started to come to class with him, but we will try to keep them separated to let him have some time to himself.  She tried to get him to get a cortizone shot for his wrist he injured.  He is starting to want some more independence and has been more active at home.  Overall, he is feeling better overall.  [name] is doing better overall.  He is still being pulled around by his aunt and she is causing some tension.  He is also trying  to watch out for his  mom as well. He denies depression or anxiety symptoms.  He is already planning to join gym at [employer].  [name] is doing well in rehab. He is glad to have some freedom from his aunt.  He and his mom are doing okay.  He needs to be more active and get out of house.  He wants to get back to his house.  He is going to start to take the steps to move back out on his own.     Expected Outcomes  Short: Continue to attend class to build indepence  Long: Continue stay positive.  Short: Continue to attend class to build indepence  Long: Continue stay positive.  Short: Add more exercise to build stamina. Long: Move out!     Interventions  Encouraged to attend Cardiac Rehabilitation for the exercise  Encouraged to attend Cardiac Rehabilitation for the exercise  Encouraged to attend Cardiac Rehabilitation for the exercise     Continue Psychosocial Services   Follow up required by staff  --  Follow up required by staff     Comments  Been dealing with heart issues since birth, first noted in 2005  --  --       Initial Review   Source of Stress Concerns  Chronic Illness  --  --        Psychosocial Discharge (Final Psychosocial Re-Evaluation): Psychosocial Re-Evaluation - 06/15/19 1543      Psychosocial Re-Evaluation   Current issues with  Current Depression;Current Stress Concerns    Comments  [name] is doing well in rehab. He is glad to have some freedom from his aunt.  He and his mom are doing okay.  He needs to be more active and get out of house.  He wants to get back to his house.  He is going to start to take the steps to move back out on his own.    Expected Outcomes  Short: Add more exercise to build stamina. Long: Move out!    Interventions  Encouraged to attend Cardiac Rehabilitation for the exercise    Continue Psychosocial Services   Follow up required by staff       Vocational Rehabilitation: Provide vocational rehab assistance to qualifying candidates.   Vocational Rehab Evaluation &  Intervention: Vocational Rehab - 02/25/19 1516      Initial Vocational Rehab Evaluation & Intervention   Assessment shows need for Vocational Rehabilitation  No       Education: Education Goals: Education classes will be provided on a variety of topics geared toward better understanding of heart health and risk factor modification. Participant will state understanding/return demonstration of topics presented as noted by education test scores.  Learning Barriers/Preferences: Learning Barriers/Preferences - 02/25/19 1513      Learning Barriers/Preferences   Learning Barriers  None    Learning Preferences  Pictoral       Education Topics:  AED/CPR: - Group verbal and written instruction with the use of models to demonstrate the basic use of the AED with the basic ABC's of resuscitation.   General Nutrition Guidelines/Fats and Fiber: -Group instruction provided by verbal, written material, models and posters to present the general guidelines for heart healthy nutrition. Gives an explanation and review of dietary fats and fiber.   Controlling Sodium/Reading Food Labels: -Group verbal and written material supporting the discussion of sodium use in heart healthy nutrition. Review and explanation with models, verbal and written materials for utilization of the food label.   Exercise Physiology & General Exercise Guidelines: - Group verbal and written instruction with models to review the exercise physiology of the cardiovascular system and associated critical values. Provides general exercise guidelines with specific guidelines to those with heart or lung disease.    Aerobic Exercise & Resistance Training: - Gives group verbal and written instruction on the various components of exercise. Focuses on aerobic and resistive training programs and the benefits of this training and how to safely progress through these programs..   Flexibility, Balance, Mind/Body Relaxation: Provides  group verbal/written instruction on the benefits of flexibility and balance training,  including mind/body exercise modes such as yoga, pilates and tai chi.  Demonstration and skill practice provided.   Cardiac Rehab from 03/26/2019 in [facility] Cardiac and Pulmonary Rehab  Date  03/26/19  Educator  AS  Instruction Review Code  1- Verbalizes Understanding      Stress and Anxiety: - Provides group verbal and written instruction about the health risks of elevated stress and causes of high stress.  Discuss the correlation between heart/lung disease and anxiety and treatment options. Review healthy ways to manage with stress and anxiety.   Depression: - Provides group verbal and written instruction on the correlation between heart/lung disease and depressed mood, treatment options, and the stigmas associated with seeking treatment.   Anatomy & Physiology of the Heart: - Group verbal and written instruction and models provide basic cardiac anatomy and physiology, with the coronary electrical and arterial systems. Review of Valvular disease and Heart Failure   Cardiac Procedures: - Group verbal and written instruction to review commonly prescribed medications for heart disease. Reviews the medication, class of the drug, and side effects. Includes the steps to properly store meds and maintain the prescription regimen. (beta blockers and nitrates)   Cardiac Medications I: - Group verbal and written instruction to review commonly prescribed medications for heart disease. Reviews the medication, class of the drug, and side effects. Includes the steps to properly store meds and maintain the prescription regimen.   Cardiac Medications II: -Group verbal and written instruction to review commonly prescribed medications for heart disease. Reviews the medication, class of the drug, and side effects. (all other drug classes)    Go Sex-Intimacy & Heart Disease, Get SMART - Goal Setting: - Group verbal and  written instruction through game format to discuss heart disease and the return to sexual intimacy. Provides group verbal and written material to discuss and apply goal setting through the application of the S.M.A.R.T. Method.   Other Matters of the Heart: - Provides group verbal, written materials and models to describe Stable Angina and Peripheral Artery. Includes description of the disease process and treatment options available to the cardiac patient.   Exercise & Equipment Safety: - Individual verbal instruction and demonstration of equipment use and safety with use of the equipment.   Cardiac Rehab from 03/26/2019 in [facility] Cardiac and Pulmonary Rehab  Date  03/10/19  Educator  AS  Instruction Review Code  1- Verbalizes Understanding      Infection Prevention: - Provides verbal and written material to individual with discussion of infection control including proper hand washing and proper equipment cleaning during exercise session.   Cardiac Rehab from 03/26/2019 in [facility] Cardiac and Pulmonary Rehab  Date  03/10/19  Educator  AS  Instruction Review Code  1- Verbalizes Understanding      Falls Prevention: - Provides verbal and written material to individual with discussion of falls prevention and safety.   Cardiac Rehab from 03/26/2019 in [facility] Cardiac and Pulmonary Rehab  Date  03/10/19  Educator  AS  Instruction Review Code  1- Verbalizes Understanding      Diabetes: - Individual verbal and written instruction to review signs/symptoms of diabetes, desired ranges of glucose level fasting, after meals and with exercise. Acknowledge that pre and post exercise glucose checks will be done for 3 sessions at entry of program.   Know Your Numbers and Risk Factors: -Group verbal and written instruction about important numbers in your health.  Discussion of what are risk factors and how they play a role in  the disease process.  Review of Cholesterol, Blood Pressure, Diabetes, and BMI and  the role they play in your overall health.   Sleep Hygiene: -Provides group verbal and written instruction about how sleep can affect your health.  Define sleep hygiene, discuss sleep cycles and impact of sleep habits. Review good sleep hygiene tips.    Other: -Provides group and verbal instruction on various topics (see comments)   Knowledge Questionnaire Score:   Core Components/Risk Factors/Patient Goals at Admission: Personal Goals and Risk Factors at Admission - 03/10/19 1706      Core Components/Risk Factors/Patient Goals on Admission    Weight Management  Yes;Obesity;Weight Loss    Intervention  Weight Management: Develop a combined nutrition and exercise program designed to reach desired caloric intake, while maintaining appropriate intake of nutrient and fiber, sodium and fats, and appropriate energy expenditure required for the weight goal.;Weight Management: Provide education and appropriate resources to help participant work on and attain dietary goals.;Weight Management/Obesity: Establish reasonable short term and long term weight goals.;Obesity: Provide education and appropriate resources to help participant work on and attain dietary goals.    Admit Weight  199 lb 8 oz (90.5 kg)    Goal Weight: Short Term  195 lb (88.5 kg)    Goal Weight: Long Term  190 lb (86.2 kg)    Expected Outcomes  Short Term: Continue to assess and modify interventions until short term weight is achieved;Long Term: Adherence to nutrition and physical activity/exercise program aimed toward attainment of established weight goal;Weight Loss: Understanding of general recommendations for a balanced deficit meal plan, which promotes 1-2 lb weight loss per week and includes a negative energy balance of [phone] kcal/d;Understanding recommendations for meals to include 15-35% energy as protein, 25-35% energy from fat, 35-60% energy from carbohydrates, less than '200mg'$  of dietary cholesterol, 20-35 gm of total  fiber daily;Understanding of distribution of calorie intake throughout the day with the consumption of 4-5 meals/snacks       Core Components/Risk Factors/Patient Goals Review:  Goals and Risk Factor Review    Row Name 04/13/19 1534 05/14/19 1358 06/15/19 1545         Core Components/Risk Factors/Patient Goals Review   Personal Goals Review  Weight Management/Obesity;Heart Failure;Hypertension  Weight Management/Obesity;Heart Failure;Hypertension  Weight Management/Obesity;Heart Failure;Hypertension     Review  When Matt first started, he lost a lot of weight very quickly.  It has now since stabilized and he is doing well now.  His pressures continue to do well.  He has not had symptoms of heart failure. He injured his wrist and it is swollen some. He is using ice and heat to treat  [name] is doing well with his weight staying steady.  He has not had any heart failure symptoms. He is doing well on his medications. His pressures have continued to do well.  [name] is doing well in rehab.  His weight is staying stedy. He has not had any edema or SOB above his normal.  Meds are going well.  Pressures have been good here in class as he does not have a cuff to check it at home.     Expected Outcomes  Short: Continue to improve strength and maintain weight.  Long: Continue to manage heart failure.  Short: Continue to maintain weight.  Long: Continue to manage heart failure.  Short: Continue to maintain weight and start checking BP.  Long: Continue to manage heart failure.        Core Components/Risk Factors/Patient Goals  at Discharge (Final Review):  Goals and Risk Factor Review - 06/15/19 1545      Core Components/Risk Factors/Patient Goals Review   Personal Goals Review  Weight Management/Obesity;Heart Failure;Hypertension    Review  [name] is doing well in rehab.  His weight is staying stedy. He has not had any edema or SOB above his normal.  Meds are going well.  Pressures have been good here in class  as he does not have a cuff to check it at home.    Expected Outcomes  Short: Continue to maintain weight and start checking BP.  Long: Continue to manage heart failure.       ITP Comments: ITP Comments    Row Name 02/25/19 1531 03/16/19 1536 03/16/19 1551 03/25/19 1059 04/02/19 1132   ITP Comments  Virtual Orientation Completed.  Documentation for diagnosis can be found in [place] encounter 02/11/19.  EP eval scheduled for 11/17 at 330pm  First full day of exercise!  Patient was oriented to gym and equipment including functions, settings, policies, and procedures.  Patient's individual exercise prescription and treatment plan were reviewed.  All starting workloads were established based on the results of the 6 minute walk test done at initial orientation visit.  The plan for exercise progression was also introduced and progression will be customized based on patient's performance and goals.  Saw podiatrist, diagnosed with Plantar Fasciitis. Received cortisone shots and pain has been relieved.  30 day review competed . ITP sent to Dr Emily Filbert for review, changes as needed and ITP approval signature.  Completed Initial RD Eval   Row Name 04/22/19 2919 05/20/19 1507 06/17/19 0705       ITP Comments  30 day review competed . ITP sent to Dr Emily Filbert for review, changes as needed and ITP approval signature  30 day review completed. ITP sent to Dr. Emily Filbert, Medical Director of Cardiac and Pulmonary Rehab. Continue with ITP unless changes are made by physician.  Department operating under reduced schedule until further notice by request from hospital leadership.  30 day chart review completed. ITP sent to Dr Zachery Dakins Medical Director, for review,changes as needed and signature.        Comments:

## 2019-06-22 ENCOUNTER — Encounter: Payer: Medicaid Other | Attending: Cardiology | Admitting: *Deleted

## 2019-06-22 ENCOUNTER — Other Ambulatory Visit: Payer: Self-pay

## 2019-06-22 DIAGNOSIS — Z7901 Long term (current) use of anticoagulants: Secondary | ICD-10-CM | POA: Insufficient documentation

## 2019-06-22 DIAGNOSIS — E669 Obesity, unspecified: Secondary | ICD-10-CM | POA: Diagnosis not present

## 2019-06-22 DIAGNOSIS — I472 Ventricular tachycardia: Secondary | ICD-10-CM | POA: Insufficient documentation

## 2019-06-22 DIAGNOSIS — I5022 Chronic systolic (congestive) heart failure: Secondary | ICD-10-CM | POA: Diagnosis not present

## 2019-06-22 DIAGNOSIS — I110 Hypertensive heart disease with heart failure: Secondary | ICD-10-CM | POA: Diagnosis not present

## 2019-06-22 DIAGNOSIS — Z79899 Other long term (current) drug therapy: Secondary | ICD-10-CM | POA: Insufficient documentation

## 2019-06-22 DIAGNOSIS — I4891 Unspecified atrial fibrillation: Secondary | ICD-10-CM | POA: Diagnosis not present

## 2019-06-22 DIAGNOSIS — I420 Dilated cardiomyopathy: Secondary | ICD-10-CM | POA: Diagnosis not present

## 2019-06-22 DIAGNOSIS — Z95810 Presence of automatic (implantable) cardiac defibrillator: Secondary | ICD-10-CM | POA: Diagnosis not present

## 2019-06-22 NOTE — Progress Notes (Signed)
Daily Session Note  Patient Details  Name: Gary Davila MRN: [id number] Date of Birth: 03-04-1991 Referring Provider:     Cardiac Rehab from 03/10/2019 in [facility] Cardiac and Pulmonary Rehab  Referring Provider  [place]      Encounter Date: 06/22/2019  Check In: Session Check In - 06/22/19 1545      Check-In   Supervising physician immediately available to respond to emergencies  See telemetry face sheet for immediately available ER MD    Location  [facility]-Cardiac & Pulmonary Rehab    Staff Present  Renita Papa, RN Moises Blood, BS, ACSM CEP, Exercise Physiologist;Joseph Tessie Fass RCP,RRT,BSRT    Virtual Visit  No    Medication changes reported      No    Fall or balance concerns reported     No    Warm-up and Cool-down  Performed on first and last piece of equipment    Resistance Training Performed  Yes    VAD Patient?  No    PAD/SET Patient?  No      Pain Assessment   Currently in Pain?  No/denies          Social History   Tobacco Use  Smoking Status Never Smoker  Smokeless Tobacco Never Used    Goals Met:  Independence with exercise equipment Exercise tolerated well No report of cardiac concerns or symptoms Strength training completed today  Goals Unmet:  Not Applicable  Comments: Pt able to follow exercise prescription today without complaint.  Will continue to monitor for progression.    Dr. Emily Filbert is Medical Director for [place] and [facility].

## 2019-06-29 ENCOUNTER — Other Ambulatory Visit: Payer: Self-pay | Admitting: Family Medicine

## 2019-06-29 ENCOUNTER — Other Ambulatory Visit: Payer: Self-pay

## 2019-06-29 ENCOUNTER — Encounter: Payer: Medicaid Other | Admitting: *Deleted

## 2019-06-29 DIAGNOSIS — I5022 Chronic systolic (congestive) heart failure: Secondary | ICD-10-CM

## 2019-06-29 DIAGNOSIS — I110 Hypertensive heart disease with heart failure: Secondary | ICD-10-CM | POA: Diagnosis not present

## 2019-06-29 DIAGNOSIS — M778 Other enthesopathies, not elsewhere classified: Secondary | ICD-10-CM

## 2019-06-29 DIAGNOSIS — I420 Dilated cardiomyopathy: Secondary | ICD-10-CM

## 2019-06-29 DIAGNOSIS — M25532 Pain in left wrist: Secondary | ICD-10-CM

## 2019-06-29 NOTE — Progress Notes (Signed)
Daily Session Note  Patient Details  Name: Gary Davila MRN: [id number] Date of Birth: [birth year] Referring Provider:     Cardiac Rehab from 03/10/2019 in [facility] Cardiac and Pulmonary Rehab  Referring Provider  [place]      Encounter Date: 06/29/2019  Check In: Session Check In - 06/29/19 1537      Check-In   Supervising physician immediately available to respond to emergencies  See telemetry face sheet for immediately available ER MD    Location  [facility]-Cardiac & Pulmonary Rehab    Staff Present  Renita Papa, RN Moises Blood, BS, ACSM CEP, Exercise Physiologist;Joseph Tessie Fass RCP,RRT,BSRT    Virtual Visit  No    Medication changes reported      No    Fall or balance concerns reported     No    Warm-up and Cool-down  Performed on first and last piece of equipment    Resistance Training Performed  Yes    VAD Patient?  No    PAD/SET Patient?  No      Pain Assessment   Currently in Pain?  No/denies          Social History   Tobacco Use  Smoking Status Never Smoker  Smokeless Tobacco Never Used    Goals Met:  Independence with exercise equipment Exercise tolerated well No report of cardiac concerns or symptoms Strength training completed today  Goals Unmet:  Not Applicable  Comments: Pt able to follow exercise prescription today without complaint.  Will continue to monitor for progression.    Dr. Emily Filbert is Medical Director for [place] and [facility].

## 2019-07-01 ENCOUNTER — Encounter: Payer: Medicaid Other | Admitting: *Deleted

## 2019-07-01 ENCOUNTER — Other Ambulatory Visit: Payer: Self-pay

## 2019-07-01 DIAGNOSIS — I420 Dilated cardiomyopathy: Secondary | ICD-10-CM

## 2019-07-01 DIAGNOSIS — I5022 Chronic systolic (congestive) heart failure: Secondary | ICD-10-CM

## 2019-07-01 DIAGNOSIS — I110 Hypertensive heart disease with heart failure: Secondary | ICD-10-CM | POA: Diagnosis not present

## 2019-07-01 NOTE — Progress Notes (Signed)
Daily Session Note  Patient Details  Name: Gary Davila MRN: [id number] Date of Birth: 01-23-1991 Referring Provider:     Cardiac Rehab from 03/10/2019 in [facility] Cardiac and Pulmonary Rehab  Referring Provider  [place]      Encounter Date: 07/01/2019  Check In: Session Check In - 07/01/19 1543      Check-In   Supervising physician immediately available to respond to emergencies  See telemetry face sheet for immediately available ER MD    Location  [facility]-Cardiac & Pulmonary Rehab    Staff Present  Renita Papa, RN BSN;Melissa Caiola RDN, Rowe Pavy, BA, ACSM CEP, Exercise Physiologist    Virtual Visit  No    Medication changes reported      No    Fall or balance concerns reported     No    Warm-up and Cool-down  Performed on first and last piece of equipment    Resistance Training Performed  Yes    VAD Patient?  No    PAD/SET Patient?  No      Pain Assessment   Currently in Pain?  No/denies          Social History   Tobacco Use  Smoking Status Never Smoker  Smokeless Tobacco Never Used    Goals Met:  Independence with exercise equipment Exercise tolerated well No report of cardiac concerns or symptoms Strength training completed today  Goals Unmet:  Not Applicable  Comments: Pt able to follow exercise prescription today without complaint.  Will continue to monitor for progression.    Dr. Emily Filbert is Medical Director for [place] and [facility].

## 2019-07-02 ENCOUNTER — Encounter: Payer: Medicaid Other | Admitting: *Deleted

## 2019-07-02 ENCOUNTER — Other Ambulatory Visit: Payer: Self-pay

## 2019-07-02 DIAGNOSIS — I5022 Chronic systolic (congestive) heart failure: Secondary | ICD-10-CM

## 2019-07-02 DIAGNOSIS — I420 Dilated cardiomyopathy: Secondary | ICD-10-CM

## 2019-07-02 DIAGNOSIS — I110 Hypertensive heart disease with heart failure: Secondary | ICD-10-CM | POA: Diagnosis not present

## 2019-07-02 NOTE — Progress Notes (Signed)
Daily Session Note  Patient Details  Name: Gary Davila MRN: [id number] Date of Birth: 10-04-1990 Referring Provider:     Cardiac Rehab from 03/10/2019 in [facility] Cardiac and Pulmonary Rehab  Referring Provider  Devore      Encounter Date: 07/02/2019  Check In: Session Check In - 07/02/19 1536      Check-In   Supervising physician immediately available to respond to emergencies  See telemetry face sheet for immediately available ER MD    Location  [facility]-Cardiac & Pulmonary Rehab    Staff Present  Renita Papa, RN BSN;Joseph [address] [place], [place], [place], CCRP, CCET    Virtual Visit  No    Medication changes reported      No    Fall or balance concerns reported     No    Warm-up and Cool-down  Performed on first and last piece of equipment    Resistance Training Performed  Yes    VAD Patient?  No    PAD/SET Patient?  No      Pain Assessment   Currently in Pain?  No/denies          Social History   Tobacco Use  Smoking Status Never Smoker  Smokeless Tobacco Never Used    Goals Met:  Independence with exercise equipment Exercise tolerated well No report of cardiac concerns or symptoms Strength training completed today  Goals Unmet:  Not Applicable  Comments: Pt able to follow exercise prescription today without complaint.  Will continue to monitor for progression.    Dr. Emily Filbert is Medical Director for [place] and [facility].

## 2019-07-08 ENCOUNTER — Encounter: Payer: Medicaid Other | Admitting: *Deleted

## 2019-07-08 ENCOUNTER — Other Ambulatory Visit: Payer: Self-pay

## 2019-07-08 DIAGNOSIS — I110 Hypertensive heart disease with heart failure: Secondary | ICD-10-CM | POA: Diagnosis not present

## 2019-07-08 DIAGNOSIS — I5022 Chronic systolic (congestive) heart failure: Secondary | ICD-10-CM

## 2019-07-08 NOTE — Progress Notes (Signed)
Daily Session Note  Patient Details  Name: Gary Davila MRN: [id number] Date of Birth: [birth year] Referring Provider:     Cardiac Rehab from 03/10/2019 in [facility] Cardiac and Pulmonary Rehab  Referring Provider  [place]      Encounter Date: 07/08/2019  Check In: Session Check In - 07/08/19 [place]      Check-In   Supervising physician immediately available to respond to emergencies  See telemetry face sheet for immediately available ER MD    Location  [facility]-Cardiac & Pulmonary Rehab    Staff Present  Heath Lark, RN, BSN, CCRP;Melissa Caiola RDN, LDN;Meredith Sherryll Burger, RN BSN    Virtual Visit  No    Medication changes reported      No    Fall or balance concerns reported     No    Warm-up and Cool-down  Performed on first and last piece of equipment    Resistance Training Performed  Yes    VAD Patient?  No    PAD/SET Patient?  No      Pain Assessment   Currently in Pain?  No/denies          Social History   Tobacco Use  Smoking Status Never Smoker  Smokeless Tobacco Never Used    Goals Met:  Independence with exercise equipment Exercise tolerated well No report of cardiac concerns or symptoms  Goals Unmet:  Not Applicable  Comments: Pt able to follow exercise prescription today without complaint.  Will continue to monitor for progression.    Dr. Emily Filbert is Medical Director for [place] and [facility].

## 2019-07-09 DIAGNOSIS — I5022 Chronic systolic (congestive) heart failure: Secondary | ICD-10-CM

## 2019-07-13 DIAGNOSIS — I5022 Chronic systolic (congestive) heart failure: Secondary | ICD-10-CM

## 2019-07-13 NOTE — Progress Notes (Signed)
Called patient to go over goals in the program. Charted goals on patient. He has no other questions or concerns at this time.

## 2019-07-15 ENCOUNTER — Encounter: Payer: Self-pay | Admitting: *Deleted

## 2019-07-15 DIAGNOSIS — I420 Dilated cardiomyopathy: Secondary | ICD-10-CM

## 2019-07-15 DIAGNOSIS — I5022 Chronic systolic (congestive) heart failure: Secondary | ICD-10-CM

## 2019-07-15 NOTE — Progress Notes (Signed)
Cardiac Individual Treatment Plan  Patient Details  Name: Gary Davila MRN: [id number] Date of Birth: [birth year] Referring Provider:     Cardiac Rehab from 03/10/2019 in [facility] Cardiac [facility]  Referring Provider  Devore      Initial Encounter Date:    Cardiac Rehab from 03/10/2019 in [facility] Cardiac [facility] Re[facility]  Date  03/10/19      Visit Diagnosis: Heart failure, chronic systolic ([place])  Dilated cardiomyopathy ([place])  Patient's Home Medications on Admission:  Current Outpatient Medications:  .  carvedilol (COREG) 25 MG tablet, TAKE (1) TABLET TWICE A DAY., Disp: , Rfl:  .  diclofenac Sodium (VOLTAREN) 1 % GEL, Apply 2 g topically 3 (three) times dailyas needed., Disp: 100 g, Rfl: 1 .  digoxin (LANOXIN) 0.125 MG tablet, TAKE 1 TABLET DAILY., Disp: , Rfl:  .  empagliflozin (JARDIANCE) 10 MG TABS tablet, Take by mouth., Disp: , Rfl:  .  eplerenone (INSPRA) 25 MG tablet, TAKE 1 TABLET DAILY., Disp: , Rfl:  .  furosemide (LASIX) 20 MG tablet, Take 40 mg by mouth 2 (two) times daily., Disp: , Rfl:  .  ibuprofen (ADVIL,MOTRIN) 200 MG tablet, Take by mouth., Disp: , Rfl:  .  sacubitril-valsartan (ENTRESTO) 24-26 MG, Take by mouth., Disp: , Rfl:  .  warfarin (COUMADIN) 5 MG tablet, Take 7.344m (one and half pill of 5444m on Tues / Thurs, then 44m27mvery other day., Disp: , Rfl:   Past Medical History: Past Medical History:  Diagnosis Date  . A-fib ([facility] . Chronic CHF (congestive heart failure) ([facility] . Coarctation of aorta   . Dilated cardiomyopathy ([place] . Hypertension   . ICD (implantable cardioverter-defibrillator) in place   . ICD (implantable cardioverter-defibrillator) lead failure   . Infection of pacemaker pocket ([facility] . Left ventricular diastolic dysfunction, NYHA class 2   . Obesity   . Ventricular tachycardia (HCC)     Tobacco Use: Social History   Tobacco Use  Smoking Status Never Smoker  Smokeless Tobacco Never Used    Labs: Recent  Review Flowsheet Data    There is no flowsheet data to display.       Exercise Target Goals: Exercise Program Goal: Individual exercise prescription set using results from initial 6 min walk test and THRR while considering  patient's activity barriers and safety.   Exercise Prescription Goal: Initial exercise prescription builds to 30-45 minutes a day of aerobic activity, 2-3 days per week.  Home exercise guidelines will be given to patient during program as part of exercise prescription that the participant will acknowledge.  Activity Barriers & Risk Stratification: Activity Barriers & Cardiac Risk Stratification - 02/25/19 1512      Activity Barriers & Cardiac Risk Stratification   Activity Barriers  Deconditioning;Balance Concerns;Assistive Device;Muscular Weakness;Shortness of Breath;Other (comment);History of Falls    Comments  problems with knees and ankles from possible nueropathy    Cardiac Risk Stratification  High       6 Minute Walk: 6 Minute Walk    Row Name 03/10/19 1653         6 Minute Walk   Distance  430 feet     Walk Time  6 minutes     # of Rest Breaks  0     MPH  0.8     METS  3.04     RPE  11     Perceived Dyspnea   0  VO2 Peak  10.65     Symptoms  Yes (comment)     Comments  foot pain - plantar fascitis 3/10     Resting HR  50 bpm     Resting BP  94/52     Resting Oxygen Saturation   99 %     Exercise Oxygen Saturation  during 6 min walk  97 %     Max Ex. HR  84 bpm     Max Ex. BP  94/54        Oxygen Initial Assessment:   Oxygen Re-Evaluation:   Oxygen Discharge (Final Oxygen Re-Evaluation):   Initial Exercise Prescription: Initial Exercise Prescription - 03/10/19 1700      Date of Initial Exercise RX and Referring Provider   Date  03/10/19    Referring Provider  Devore      Treadmill   MPH  0.5    Grade  0    Minutes  15      Recumbant Bike   Level  1    RPM  60    Minutes  15    METs  3      NuStep   Level  2     SPM  80    Minutes  15    METs  3      Arm Ergometer   Level  1    RPM  25    Minutes  15    METs  3      REL-XR   Level  2    Speed  50    Minutes  15    METs  3      T5 Nustep   Level  1    SPM  80    Minutes  15    METs  3      Biostep-RELP   Level  2    SPM  50    Minutes  15    METs  3      Prescription Details   Frequency (times per week)  3    Duration  Progress to 30 minutes of continuous aerobic without signs/symptoms of physical distress      Intensity   THRR 40-80% of Max Heartrate  106-162    Ratings of Perceived Exertion  11-13    Perceived Dyspnea  0-4      Resistance Training   Training Prescription  Yes    Weight  3 lb    Reps  10-15       Perform Capillary Blood Glucose checks as needed.  Exercise Prescription Changes: Exercise Prescription Changes    Row Name 03/10/19 1700 04/02/19 1400 04/13/19 1500 04/15/19 1200 04/30/19 1500     Response to Exercise   Blood Pressure (Admit)  94/52  94/50  --  88/62  98/58   Blood Pressure (Exercise)  94/54  92/66  --  102/62  100/62   Blood Pressure (Exit)  --  94/60  --  104/54  98/60   Heart Rate (Admit)  50 bpm  66 bpm  --  79 bpm  64 bpm   Heart Rate (Exercise)  84 bpm  95 bpm  --  96 bpm  96 bpm   Heart Rate (Exit)  72 bpm  73 bpm  --  76 bpm  61 bpm   Oxygen Saturation (Admit)  99 %  --  --  --  --   Oxygen Saturation (Exercise)  97 %  --  --  --  --  Rating of Perceived Exertion (Exercise)  11  11  --  13  11   Perceived Dyspnea (Exercise)  0  --  --  --  --   Symptoms  leg/foot  pain  plantar fascitis  --  --  none  none   Duration  --  --  --  Continue with 30 min of aerobic exercise without signs/symptoms of physical distress.  Continue with 30 min of aerobic exercise without signs/symptoms of physical distress.   Intensity  --  --  --  THRR unchanged  THRR unchanged     Progression   Progression  --  Continue to progress workloads to maintain intensity without signs/symptoms of  physical distress.  --  Continue to progress workloads to maintain intensity without signs/symptoms of physical distress.  Continue to progress workloads to maintain intensity without signs/symptoms of physical distress.   Average METs  --  1.8  --  1.63  1.4     Resistance Training   Training Prescription  --  Yes  --  Yes  Yes   Weight  --  3 lb  --  3 lb  3 lb   Reps  --  10-15  --  10-15  --     Interval Training   Interval Training  --  No  --  No  No     Treadmill   MPH  --  0.5  --  --  --   Grade  --  0  --  --  --   Minutes  --  15  --  --  --     NuStep   Level  --  2  --  3  4   SPM  --  80  --  --  80   Minutes  --  15  --  15  15   METs  --  2.1  --  2  1.2     Arm Ergometer   Level  --  1  --  1  --   RPM  --  25  --  --  --   Minutes  --  15  --  15  --   METs  --  1.7  --  1.5  --     REL-XR   Level  --  --  --  1  1   Speed  --  --  --  --  50   Minutes  --  --  --  15  15   METs  --  --  --  1.4  1.1     Biostep-RELP   Level  --  2  --  --  --   SPM  --  50  --  --  --   Minutes  --  15  --  --  --   METs  --  2  --  --  --     Home Exercise Plan   Plans to continue exercise at  --  --  Home (comment) walking, staff videos  Home (comment) walking, staff videos  --   Frequency  --  --  Add 3 additional days to program exercise sessions.  Add 3 additional days to program exercise sessions.  --   Initial Home Exercises Provided  --  --  04/13/19  04/13/19  --   [place] Name 05/14/19 1400 05/29/19 0900 06/11/19 0900 06/25/19 1100 07/08/19 1400     Response  to Exercise   Blood Pressure (Admit)  92/62  90/56  102/62  90/62  88/58   Blood Pressure (Exercise)  104/62  110/66  98/60  134/82  124/70   Blood Pressure (Exit)  90/54  96/66  90/60  100/62  110/70   Heart Rate (Admit)  71 bpm  90 bpm  65 bpm  56 bpm  67 bpm   Heart Rate (Exercise)  95 bpm  130 bpm  99 bpm  139 bpm  108 bpm   Heart Rate (Exit)  93 bpm  104 bpm  81 bpm  50 bpm  76 bpm   Rating of  Perceived Exertion (Exercise)  _0 Symptoms  none  none  knee swollen this week  --  none   Duration  Continue with 30 min of aerobic exercise without signs/symptoms of physical distress.  Continue with 30 min of aerobic exercise without signs/symptoms of physical distress.  Continue with 30 min of aerobic exercise without signs/symptoms of physical distress.  Continue with 30 min of aerobic exercise without signs/symptoms of physical distress.  Continue with 30 min of aerobic exercise without signs/symptoms of physical distress.   Intensity  THRR unchanged  THRR unchanged  THRR unchanged  THRR unchanged  THRR unchanged     Progression   Progression  Continue to progress workloads to maintain intensity without signs/symptoms of physical distress.  Continue to progress workloads to maintain intensity without signs/symptoms of physical distress.  Continue to progress workloads to maintain intensity without signs/symptoms of physical distress.  Continue to progress workloads to maintain intensity without signs/symptoms of physical distress.  Continue to progress workloads to maintain intensity without signs/symptoms of physical distress.   Average METs  1.6  2  1.72  1.8  2.12     Resistance Training   Training Prescription  Yes  Yes  Yes  Yes  Yes   Weight  3 lb  3 lb  3 lb  3 lb  3 lb   Reps  10-15  10-15  10-15  10-15  10-15     Interval Training   Interval Training  No  No  No  No  No     Treadmill   MPH  --  1.5  1.5  1.5  1.5   Grade  --  0  0  0  0   Minutes  --  _1 METs  --  --  2.15  2.15  2.15     Recumbant Elliptical   Level  --  --  1  --  --   Minutes  --  --  15  --  --   METs  --  --  1.2  --  --     Elliptical   Level  --  --  1  --  --   Speed  --  --  2  --  --   Minutes  --  --  4  --  --     REL-XR   Level  --  --  --  1  1   Minutes  --  --  --  15  15   METs  --  --  --  1.4  2.1     T5 Nustep   Level  --  1  1  --  --   SPM   --  80  --  --  --   Minutes  --  15  15  --  --   METs  --  1.8  1.8  --  --     Home Exercise Plan   Plans to continue exercise at  Home (comment) walking, staff videos  Home (comment) walking, staff videos  Home (comment) walking, staff videos  Home (comment) walking, staff videos  Home (comment) walking, staff videos   Frequency  Add 3 additional days to program exercise sessions.  Add 3 additional days to program exercise sessions.  Add 3 additional days to program exercise sessions.  Add 3 additional days to program exercise sessions.  Add 3 additional days to program exercise sessions.   Initial Home Exercises Provided  04/13/19  04/13/19  04/13/19  04/13/19  04/13/19      Exercise Comments: Exercise Comments    Row Name 03/16/19 1536           Exercise Comments  First full day of exercise!  Patient was oriented to gym and equipment including functions, settings, policies, and procedures.  Patient's individual exercise prescription and treatment plan were reviewed.  All starting workloads were established based on the results of the 6 minute walk test done at initial orientation visit.  The plan for exercise progression was also introduced and progression will be customized based on patient's performance and goals.          Exercise Goals and Review: Exercise Goals    Row Name 03/10/19 1704             Exercise Goals   Increase Physical Activity  Yes       Intervention  Provide advice, education, support and counseling about physical activity/exercise needs.;Develop an individualized exercise prescription for aerobic and resistive training based on initial evaluation findings, risk stratification, comorbidities and participant's personal goals.       Expected Outcomes  Short Term: Attend rehab on a regular basis to increase amount of physical activity.;Long Term: Add in home exercise to make exercise part of routine and to increase amount of physical activity.;Long Term:  Exercising regularly at least 3-5 days a week.       Increase Strength and Stamina  Yes       Intervention  Provide advice, education, support and counseling about physical activity/exercise needs.;Develop an individualized exercise prescription for aerobic and resistive training based on initial evaluation findings, risk stratification, comorbidities and participant's personal goals.       Expected Outcomes  Short Term: Increase workloads from initial exercise prescription for resistance, speed, and METs.;Short Term: Perform resistance training exercises routinely during rehab and add in resistance training at home;Long Term: Improve cardiorespiratory fitness, muscular endurance and strength as measured by increased METs and functional capacity (6MWT)       Able to understand and use rate of perceived exertion (RPE) scale  Yes       Intervention  Provide education and explanation on how to use RPE scale       Expected Outcomes  Short Term: Able to use RPE daily in rehab to express subjective intensity level;Long Term:  Able to use RPE to guide intensity level when exercising independently       Knowledge and understanding of Target Heart Rate Range (THRR)  Yes       Intervention  Provide education and explanation of THRR including how the numbers were predicted and where they are located for reference  Expected Outcomes  Short Term: Able to state/look up THRR;Short Term: Able to use daily as guideline for intensity in rehab;Long Term: Able to use THRR to govern intensity when exercising independently       Understanding of Exercise Prescription  Yes       Intervention  Provide education, explanation, and written materials on patient's individual exercise prescription       Expected Outcomes  Short Term: Able to explain program exercise prescription;Long Term: Able to explain home exercise prescription to exercise independently          Exercise Goals Re-Evaluation : Exercise Goals  Re-Evaluation    Row Name 03/16/19 1537 04/02/19 1456 04/13/19 1527 04/30/19 1528 05/14/19 1353     Exercise Goal Re-Evaluation   Exercise Goals Review  Increase Physical Activity;Knowledge and understanding of Target Heart Rate Range (THRR);Able to check pulse independently;Understanding of Exercise Prescription;Able to understand and use rate of perceived exertion (RPE) scale  Increase Physical Activity;Increase Strength and Stamina;Able to understand and use rate of perceived exertion (RPE) scale;Able to understand and use Dyspnea scale;Knowledge and understanding of Target Heart Rate Range (THRR);Understanding of Exercise Prescription  Increase Physical Activity;Increase Strength and Stamina;Able to understand and use rate of perceived exertion (RPE) scale;Able to understand and use Dyspnea scale;Knowledge and understanding of Target Heart Rate Range (THRR);Understanding of Exercise Prescription;Able to check pulse independently  Increase Physical Activity;Increase Strength and Stamina;Able to understand and use rate of perceived exertion (RPE) scale;Able to understand and use Dyspnea scale;Knowledge and understanding of Target Heart Rate Range (THRR);Understanding of Exercise Prescription  Increase Physical Activity;Increase Strength and Stamina;Understanding of Exercise Prescription   Comments  Reviewed RPE scale, THR and program prescription with pt today.  Pt voiced understanding and was given a copy of goals to take home.  Gary Davila is doing well in rehab.  he has had some ankle pain but continues to attend and try all machines.  Staff will monitor progess.  Gary Davila is doing well in rehab.  He has started to be able to move more at home.  He has no real routine going.  Reviewed home exercise with pt today.  Pt plans to walk and use staff videos at home for exercise.  Reviewed THR, pulse, RPE, sign and symptoms, and when to call 911 or MD.  Also discussed weather considerations and indoor options.  Pt voiced  understanding.  Gary Davila has increased to 4 lb weight with one hand - other wrist is sprained and he is using 2 lb wieght until it feels better.  He did get new shoes and says he hasnt ad any new foot pain.  Gary Davila is doing well.  He does his daily stretches and walks some with his mom.  He has been able to be more active around the house.  We talked about walking every day at home.  We talked about a goal of walking down here.  Overall, stamina is improving.   Expected Outcomes  Short: Use RPE daily to regulate intensity. Long: Follow program prescription in THR.  Short - conitnue to attend consistently Long - improve stamina  Short: Start to add in exercise at home.   Long: Continue to improve stamina.  Short - exercise consistently on his own Long : improve overall stamina  Short: Walk more at home and starting walking to class.  Long:  Continue to improve stamina.   [place] Name 05/29/19 8333 06/11/19 0920 06/15/19 1541 06/25/19 1126 07/08/19 1406     Exercise Goal Re-Evaluation  Exercise Goals Review  Increase Physical Activity;Able to understand and use rate of perceived exertion (RPE) scale;Knowledge and understanding of Target Heart Rate Range (THRR);Understanding of Exercise Prescription;Increase Strength and Stamina;Able to understand and use Dyspnea scale;Able to check pulse independently  Increase Physical Activity;Increase Strength and Stamina;Understanding of Exercise Prescription  Increase Physical Activity;Increase Strength and Stamina;Understanding of Exercise Prescription  Increase Physical Activity;Increase Strength and Stamina;Able to understand and use rate of perceived exertion (RPE) scale;Able to understand and use Dyspnea scale;Knowledge and understanding of Target Heart Rate Range (THRR);Able to check pulse independently;Understanding of Exercise Prescription  Increase Physical Activity;Increase Strength and Stamina;Understanding of Exercise Prescription   Comments  Gary Davila states he stamina is  getting better.  He can walk up stairs at home unassisted and can stand the whole time in the shower.  He walks outside at home.  Gary Davila has been doing better in rehab.  He has actually been able to tackle the elliptical some.  He was not able to increase this week as his knee swelled up and he was not able to walk as well.  We will continue to monitor his progress.  Gary Davila is doing well in rehab.  He is getting in some exercise/activity by going up and down stairs and feeding dog.  His goal was to walk to and from rehab and he is now doing it!  Gary Davila has stated he feels he is getting stronger.  He does continue to walk down, exercise and walk back up.  Gary Davila continues to improve in rehab.  He is up to 2.1 METs on the XR.  We will continue to monitor his progress.   Expected Outcomes  Short - continue to be active at home Long : walk down to rehab gym before and after exercise  Short: Increase speed on treadmill and seated equipment.  Long: Continue to improve stamina.  Short: More activity at home.  Long: Continue to improve stamina.  Short:  more active at home Long: improve overall stamina  Short: Continue to try to increase speed on treadmill  Long: Continue to move more at home.   [place] Name 07/13/19 1548             Exercise Goal Re-Evaluation   Exercise Goals Review  Increase Physical Activity;Increase Strength and Stamina       Comments  Gary Davila has got resistance bands that he obtained from rehab and is able to do some stretches at home. When he is done with the program Gary Davila is thinking about going to [facility] [employer].       Expected Outcomes  Short: due more home exercises. Long: Work on exercise at [employer] (Final Exercise Prescription Changes): Exercise Prescription Changes - 07/08/19 1400      Response to Exercise   Blood Pressure (Admit)  88/58    Blood Pressure (Exercise)  124/70    Blood Pressure (Exit)  110/70    Heart Rate (Admit)  67 bpm     Heart Rate (Exercise)  108 bpm    Heart Rate (Exit)  76 bpm    Rating of Perceived Exertion (Exercise)  11    Symptoms  none    Duration  Continue with 30 min of aerobic exercise without signs/symptoms of physical distress.    Intensity  THRR unchanged      Progression   Progression  Continue to progress workloads to maintain intensity without signs/symptoms of physical distress.  Average METs  2.12      Resistance Training   Training Prescription  Yes    Weight  3 lb    Reps  10-15      Interval Training   Interval Training  No      Treadmill   MPH  1.5    Grade  0    Minutes  15    METs  2.15      REL-XR   Level  1    Minutes  15    METs  2.1      Home Exercise Plan   Plans to continue exercise at  Home (comment)   walking, staff videos   Frequency  Add 3 additional days to program exercise sessions.    Initial Home Exercises Provided  04/13/19       Nutrition:  Target Goals: Understanding of nutrition guidelines, daily intake of sodium <1527m, cholesterol <2027m calories 30% from fat and 7% or less from saturated fats, daily to have 5 or more servings of fruits and vegetables.  Biometrics:    Nutrition Therapy Plan and Nutrition Goals: Nutrition Therapy & Goals - 04/02/19 1102      Nutrition Therapy   Diet  Low Na, HH diet    Drug/Food Interactions  Coumadin/Vit K    Protein (specify units)  75g    Fiber  30 grams    Whole Grain Foods  3 servings    Saturated Fats  12 max. grams    Fruits and Vegetables  5 servings/day    Sodium  1.5 grams      Personal Nutrition Goals   Nutrition Goal  ST: switch out one glass of sugar-sweetened beverage with water LT: increase strength and muscle mass    Comments  B: bowl of cereal cheerios, cinnamon toast crunch, rice krispies (whole milk). L: chicken with vegetables sometimes D: varies (meat, starch, vegetable) - red meat or poultry. Drinks: sweet tea or soda. 2-3 glasses/can. Discussed HH eating and MNT for  muscle building.      Intervention Plan   Intervention  Prescribe, educate and counsel regarding individualized specific dietary modifications aiming towards targeted core components such as weight, hypertension, lipid management, diabetes, heart failure and other comorbidities.;Nutrition handout(s) given to patient.    Expected Outcomes  Short Term Goal: Understand basic principles of dietary content, such as calories, fat, sodium, cholesterol and nutrients.;Short Term Goal: A plan has been developed with personal nutrition goals set during dietitian appointment.;Long Term Goal: Adherence to prescribed nutrition plan.       Nutrition Assessments:   Nutrition Goals Re-Evaluation: Nutrition Goals Re-Evaluation    Row Name 04/30/19 1346 06/08/19 1608 07/09/19 1712         Goals   Nutrition Goal  ST: switch out one glass of sugar-sweetened beverage with water LT: increase strength and muscle mass  ST: switch out one glass of sugar-sweetened beverage with water LT: increase strength and muscle mass  ST: switch out one glass of sugar-sweetened beverage with water LT: increase strength and muscle mass     Comment  Pt reports thinks his strength is coming back.  Some of the time is switching, pt reports he just needs to get used to it and will continue to work on it. Pt reports strength is ok when legs arent in pain.  Continue with current changes     Expected Outcome  --  ST: switch out one glass of sugar-sweetened beverage with water LT: increase  strength and muscle mass  ST: switch out one glass of sugar-sweetened beverage with water LT: increase strength and muscle mass        Nutrition Goals Discharge (Final Nutrition Goals Re-Evaluation): Nutrition Goals Re-Evaluation - 07/09/19 1712      Goals   Nutrition Goal  ST: switch out one glass of sugar-sweetened beverage with water LT: increase strength and muscle mass    Comment  Continue with current changes    Expected Outcome  ST: switch  out one glass of sugar-sweetened beverage with water LT: increase strength and muscle mass       Psychosocial: Target Goals: Acknowledge presence or absence of significant depression and/or stress, maximize coping skills, provide positive support system. Participant is able to verbalize types and ability to use techniques and skills needed for reducing stress and depression.   Initial Review & Psychosocial Screening: Initial Psych Review & Screening - 02/25/19 1514      Initial Review   Current issues with  Current Stress Concerns    Source of Stress Concerns  Chronic Illness    Comments  Been dealing with heart issues since birth, first noted in 2005      [place]?  Yes   mom, aunt   Concerns  Recent loss of significant other    Comments  Recently lost father in August      Barriers   Psychosocial barriers to participate in program  The patient should benefit from training in stress management and relaxation.;Psychosocial barriers identified (see note)      Screening Interventions   Interventions  Encouraged to exercise;To provide support and resources with identified psychosocial needs;Provide feedback about the scores to participant    Expected Outcomes  Short Term goal: Utilizing psychosocial counselor, staff and physician to assist with identification of specific Stressors or current issues interfering with healing process. Setting desired goal for each stressor or current issue identified.;Long Term Goal: Stressors or current issues are controlled or eliminated.;Short Term goal: Identification and review with participant of any Quality of Life or Depression concerns found by scoring the questionnaire.;Long Term goal: The participant improves quality of Life and PHQ9 Scores as seen by post scores and/or verbalization of changes       Quality of Life Scores:  Quality of Life - 03/10/19 1706      Quality of Life   Select  Quality of Life       Quality of Life Scores   Health/Function Pre  20.43 %    Socioeconomic Pre  23.29 %    Psych/Spiritual Pre  21.83 %    Family Pre  26.67 %    GLOBAL Pre  21.95 %      Scores of 19 and below usually indicate a poorer quality of life in these areas.  A difference of  2-3 points is a clinically meaningful difference.  A difference of 2-3 points in the total score of the Quality of Life Index has been associated with significant improvement in overall quality of life, self-image, physical symptoms, and general health in studies assessing change in quality of life.  PHQ-9: Recent Review Flowsheet Data    Depression screen [facility] 2/9 04/29/2019 03/10/2019 03/04/2019   Decreased Interest 0 0 0   Down, Depressed, Hopeless 0 0 0   PHQ - 2 Score 0 0 0   Altered sleeping - 0 -   Tired, decreased energy - 1 -   Change in appetite -  0 -   Feeling bad or failure about yourself  - 0 -   Trouble concentrating - 0 -   Moving slowly or fidgety/restless - 3 -   Suicidal thoughts - 0 -   PHQ-9 Score - 4 -   Difficult doing work/chores - Somewhat difficult -     Interpretation of Total Score  Total Score Depression Severity:  1-4 = Minimal depression, 5-9 = Mild depression, 10-14 = Moderate depression, 15-19 = Moderately severe depression, 20-27 = Severe depression   Psychosocial Evaluation and Intervention:   Psychosocial Re-Evaluation: Psychosocial Re-Evaluation    [place] Name 04/13/19 1530 05/14/19 1355 06/15/19 1543 07/13/19 1551       Psychosocial Re-Evaluation   Current issues with  Current Depression;Current Stress Concerns  Current Depression;Current Stress Concerns  Current Depression;Current Stress Concerns  None Identified    Comments  Gary Davila's family is very involved in his life.  His aunt has started to come to class with him, but we will try to keep them separated to let him have some time to himself.  She tried to get him to get a cortizone shot for his wrist he injured.  He is starting to  want some more independence and has been more active at home.  Overall, he is feeling better overall.  Gary Davila is doing better overall.  He is still being pulled around by his aunt and she is causing some tension.  He is also trying  to watch out for his mom as well. He denies depression or anxiety symptoms.  He is already planning to join gym at [employer].  Gary Davila is doing well in rehab. He is glad to have some freedom from his aunt.  He and his mom are doing okay.  He needs to be more active and get out of house.  He wants to get back to his house.  He is going to start to take the steps to move back out on his own.  Gary Davila is trying to stay positive in Cardiac rehab. He knows why he has to do rehab to keep his heart healthy and decrease stress. He states that he has no stressors at this time.    Expected Outcomes  Short: Continue to attend class to build indepence  Long: Continue stay positive.  Short: Continue to attend class to build indepence  Long: Continue stay positive.  Short: Add more exercise to build stamina. Long: Move out!  Short: Attend HeartTrack stress management education to decrease stress. Long: Maintain exercise Post HeartTrack to keep stress at a minimum.    Interventions  Encouraged to attend Cardiac Rehabilitation for the exercise  Encouraged to attend Cardiac Rehabilitation for the exercise  Encouraged to attend Cardiac Rehabilitation for the exercise  Encouraged to attend Cardiac Rehabilitation for the exercise    Continue Psychosocial Services   Follow up required by staff  --  Follow up required by staff  Follow up required by staff    Comments  Been dealing with heart issues since birth, first noted in 2005  --  --  --      Initial Review   Source of Stress Concerns  Chronic Illness  --  --  --       Psychosocial Discharge (Final Psychosocial Re-Evaluation): Psychosocial Re-Evaluation - 07/13/19 1551      Psychosocial Re-Evaluation   Current issues with  None Identified     Comments  Gary Davila is trying to stay positive in Cardiac rehab. He knows  why he has to do rehab to keep his heart healthy and decrease stress. He states that he has no stressors at this time.    Expected Outcomes  Short: Attend HeartTrack stress management education to decrease stress. Long: Maintain exercise Post HeartTrack to keep stress at a minimum.    Interventions  Encouraged to attend Cardiac Rehabilitation for the exercise    Continue Psychosocial Services   Follow up required by staff       Vocational Rehabilitation: Provide vocational rehab assistance to qualifying candidates.   Vocational Rehab Evaluation & Intervention: Vocational Rehab - 02/25/19 1516      Initial Vocational Rehab Evaluation & Intervention   Assessment shows need for Vocational Rehabilitation  No       Education: Education Goals: Education classes will be provided on a variety of topics geared toward better understanding of heart health and risk factor modification. Participant will state understanding/return demonstration of topics presented as noted by education test scores.  Learning Barriers/Preferences: Learning Barriers/Preferences - 02/25/19 1513      Learning Barriers/Preferences   Learning Barriers  None    Learning Preferences  Pictoral       Education Topics:  AED/CPR: - Group verbal and written instruction with the use of models to demonstrate the basic use of the AED with the basic ABC's of resuscitation.   General Nutrition Guidelines/Fats and Fiber: -Group instruction provided by verbal, written material, models and posters to present the general guidelines for heart healthy nutrition. Gives an explanation and review of dietary fats and fiber.   Controlling Sodium/Reading Food Labels: -Group verbal and written material supporting the discussion of sodium use in heart healthy nutrition. Review and explanation with models, verbal and written materials for utilization of the food  label.   Exercise Physiology & General Exercise Guidelines: - Group verbal and written instruction with models to review the exercise physiology of the cardiovascular system and associated critical values. Provides general exercise guidelines with specific guidelines to those with heart or lung disease.    Aerobic Exercise & Resistance Training: - Gives group verbal and written instruction on the various components of exercise. Focuses on aerobic and resistive training programs and the benefits of this training and how to safely progress through these programs..   Flexibility, Balance, Mind/Body Relaxation: Provides group verbal/written instruction on the benefits of flexibility and balance training, including mind/body exercise modes such as yoga, pilates and tai chi.  Demonstration and skill practice provided.   Cardiac Rehab from 03/26/2019 in [facility] Cardiac and Pulmonary Rehab  Date  03/26/19  Educator  AS  Instruction Review Code  1- Verbalizes Understanding      Stress and Anxiety: - Provides group verbal and written instruction about the health risks of elevated stress and causes of high stress.  Discuss the correlation between heart/lung disease and anxiety and treatment options. Review healthy ways to manage with stress and anxiety.   Depression: - Provides group verbal and written instruction on the correlation between heart/lung disease and depressed mood, treatment options, and the stigmas associated with seeking treatment.   Anatomy & Physiology of the Heart: - Group verbal and written instruction and models provide basic cardiac anatomy and physiology, with the coronary electrical and arterial systems. Review of Valvular disease and Heart Failure   Cardiac Procedures: - Group verbal and written instruction to review commonly prescribed medications for heart disease. Reviews the medication, class of the drug, and side effects. Includes the steps to properly store meds and  maintain the prescription regimen. (beta blockers and nitrates)   Cardiac Medications I: - Group verbal and written instruction to review commonly prescribed medications for heart disease. Reviews the medication, class of the drug, and side effects. Includes the steps to properly store meds and maintain the prescription regimen.   Cardiac Medications II: -Group verbal and written instruction to review commonly prescribed medications for heart disease. Reviews the medication, class of the drug, and side effects. (all other drug classes)    Go Sex-Intimacy & Heart Disease, Get SMART - Goal Setting: - Group verbal and written instruction through game format to discuss heart disease and the return to sexual intimacy. Provides group verbal and written material to discuss and apply goal setting through the application of the S.M.A.R.T. Method.   Other Matters of the Heart: - Provides group verbal, written materials and models to describe Stable Angina and Peripheral Artery. Includes description of the disease process and treatment options available to the cardiac patient.   Exercise & Equipment Safety: - Individual verbal instruction and demonstration of equipment use and safety with use of the equipment.   Cardiac Rehab from 03/26/2019 in [facility] Cardiac and Pulmonary Rehab  Date  03/10/19  Educator  AS  Instruction Review Code  1- Verbalizes Understanding      Infection Prevention: - Provides verbal and written material to individual with discussion of infection control including proper hand washing and proper equipment cleaning during exercise session.   Cardiac Rehab from 03/26/2019 in [facility] Cardiac and Pulmonary Rehab  Date  03/10/19  Educator  AS  Instruction Review Code  1- Verbalizes Understanding      Falls Prevention: - Provides verbal and written material to individual with discussion of falls prevention and safety.   Cardiac Rehab from 03/26/2019 in [facility] Cardiac and Pulmonary  Rehab  Date  03/10/19  Educator  AS  Instruction Review Code  1- Verbalizes Understanding      Diabetes: - Individual verbal and written instruction to review signs/symptoms of diabetes, desired ranges of glucose level fasting, after meals and with exercise. Acknowledge that pre and post exercise glucose checks will be done for 3 sessions at entry of program.   Know Your Numbers and Risk Factors: -Group verbal and written instruction about important numbers in your health.  Discussion of what are risk factors and how they play a role in the disease process.  Review of Cholesterol, Blood Pressure, Diabetes, and BMI and the role they play in your overall health.   Sleep Hygiene: -Provides group verbal and written instruction about how sleep can affect your health.  Define sleep hygiene, discuss sleep cycles and impact of sleep habits. Review good sleep hygiene tips.    Other: -Provides group and verbal instruction on various topics (see comments)   Knowledge Questionnaire Score:   Core Components/Risk Factors/Patient Goals at Admission: Personal Goals and Risk Factors at Admission - 03/10/19 1706      Core Components/Risk Factors/Patient Goals on Admission    Weight Management  Yes;Obesity;Weight Loss    Intervention  Weight Management: Develop a combined nutrition and exercise program designed to reach desired caloric intake, while maintaining appropriate intake of nutrient and fiber, sodium and fats, and appropriate energy expenditure required for the weight goal.;Weight Management: Provide education and appropriate resources to help participant work on and attain dietary goals.;Weight Management/Obesity: Establish reasonable short term and long term weight goals.;Obesity: Provide education and appropriate resources to help participant work on and attain dietary goals.  Admit Weight  199 lb 8 oz (90.5 kg)    Goal Weight: Short Term  195 lb (88.5 kg)    Goal Weight: Long Term  190  lb (86.2 kg)    Expected Outcomes  Short Term: Continue to assess and modify interventions until short term weight is achieved;Long Term: Adherence to nutrition and physical activity/exercise program aimed toward attainment of established weight goal;Weight Loss: Understanding of general recommendations for a balanced deficit meal plan, which promotes 1-2 lb weight loss per week and includes a negative energy balance of [phone] kcal/d;Understanding recommendations for meals to include 15-35% energy as protein, 25-35% energy from fat, 35-60% energy from carbohydrates, less than 221m of dietary cholesterol, 20-35 gm of total fiber daily;Understanding of distribution of calorie intake throughout the day with the consumption of 4-5 meals/snacks       Core Components/Risk Factors/Patient Goals Review:  Goals and Risk Factor Review    Row Name 04/13/19 1534 05/14/19 1358 06/15/19 1545 07/13/19 1554       Core Components/Risk Factors/Patient Goals Review   Personal Goals Review  Weight Management/Obesity;Heart Failure;Hypertension  Weight Management/Obesity;Heart Failure;Hypertension  Weight Management/Obesity;Heart Failure;Hypertension  Weight Management/Obesity;Other;Heart Failure    Review  When Gary Davila first started, he lost a lot of weight very quickly.  It has now since stabilized and he is doing well now.  His pressures continue to do well.  He has not had symptoms of heart failure. He injured his wrist and it is swollen some. He is using ice and heat to treat  MCatalina Antiguais doing well with his weight staying steady.  He has not had any heart failure symptoms. He is doing well on his medications. His pressures have continued to do well.  MCatalina Antiguais doing well in rehab.  His weight is staying stedy. He has not had any edema or SOB above his normal.  Meds are going well.  Pressures have been good here in class as he does not have a cuff to check it at home.  Patient states that he would like to lose more weight. He  has lost about 8 pounds since the start of the program. He also has had no signs of hypertension in class. The last time he talked to his cardiologist he states that there is nothing new going on with his heart and everything looks good. MCatalina Antiguais watching is fluids and wants to lose more weight in the next couple of weeks,    Expected Outcomes  Short: Continue to improve strength and maintain weight.  Long: Continue to manage heart failure.  Short: Continue to maintain weight.  Long: Continue to manage heart failure.  Short: Continue to maintain weight and start checking BP.  Long: Continue to manage heart failure.  Short: lose 5 pounds in the next couple weeks. Long: reach an obtainable weight goal.       Core Components/Risk Factors/Patient Goals at Discharge (Final Review):  Goals and Risk Factor Review - 07/13/19 1554      Core Components/Risk Factors/Patient Goals Review   Personal Goals Review  Weight Management/Obesity;Other;Heart Failure    Review  Patient states that he would like to lose more weight. He has lost about 8 pounds since the start of the program. He also has had no signs of hypertension in class. The last time he talked to his cardiologist he states that there is nothing new going on with his heart and everything looks good. MCatalina Antiguais watching is fluids and wants to lose  more weight in the next couple of weeks,    Expected Outcomes  Short: lose 5 pounds in the next couple weeks. Long: reach an obtainable weight goal.       ITP Comments: ITP Comments    Row Name 02/25/19 1531 03/16/19 1536 03/16/19 1551 03/25/19 1059 04/02/19 1132   ITP Comments  Virtual Orientation Completed.  Documentation for diagnosis can be found in [place] encounter 02/11/19.  EP eval scheduled for 11/17 at 330pm  First full day of exercise!  Patient was oriented to gym and equipment including functions, settings, policies, and procedures.  Patient's individual exercise prescription and treatment plan  were reviewed.  All starting workloads were established based on the results of the 6 minute walk test done at initial orientation visit.  The plan for exercise progression was also introduced and progression will be customized based on patient's performance and goals.  Saw podiatrist, diagnosed with Plantar Fasciitis. Received cortisone shots and pain has been relieved.  30 day review competed . ITP sent to Dr Emily Filbert for review, changes as needed and ITP approval signature.  Completed Initial RD Eval   Row Name 04/22/19 7169 05/20/19 1507 06/17/19 0705 07/15/19 1040     ITP Comments  30 day review competed . ITP sent to Dr Emily Filbert for review, changes as needed and ITP approval signature  30 day review completed. ITP sent to Dr. Emily Filbert, Medical Director of Cardiac and Pulmonary Rehab. Continue with ITP unless changes are made by physician.  Department operating under reduced schedule until further notice by request from hospital leadership.  30 day chart review completed. ITP sent to Dr Zachery Dakins Medical Director, for review,changes as needed and signature.  30 day chart review completed. ITP sent to Dr Zachery Dakins Medical Director, for review,changes as needed and signature.       Comments:

## 2019-07-20 ENCOUNTER — Encounter: Payer: Self-pay | Admitting: *Deleted

## 2019-07-20 ENCOUNTER — Telehealth: Payer: Self-pay | Admitting: *Deleted

## 2019-07-20 DIAGNOSIS — I420 Dilated cardiomyopathy: Secondary | ICD-10-CM

## 2019-07-20 DIAGNOSIS — I5022 Chronic systolic (congestive) heart failure: Secondary | ICD-10-CM

## 2019-07-20 NOTE — Telephone Encounter (Signed)
Gary Davila called to let us know that he will be out again today.  His knee is still swollen and hurting, but improved since last week.  He is still not sure how exactly it ended up swollen.  He also mentioned that his dog passed last week, so he has been having a rough time dealing with that as well.

## 2019-07-23 ENCOUNTER — Ambulatory Visit: Payer: Medicaid Other

## 2019-07-27 ENCOUNTER — Other Ambulatory Visit: Payer: Self-pay

## 2019-07-27 ENCOUNTER — Encounter: Payer: Medicare Other | Attending: Cardiology | Admitting: *Deleted

## 2019-07-27 DIAGNOSIS — I110 Hypertensive heart disease with heart failure: Secondary | ICD-10-CM | POA: Insufficient documentation

## 2019-07-27 DIAGNOSIS — I4891 Unspecified atrial fibrillation: Secondary | ICD-10-CM | POA: Diagnosis not present

## 2019-07-27 DIAGNOSIS — I5022 Chronic systolic (congestive) heart failure: Secondary | ICD-10-CM | POA: Diagnosis not present

## 2019-07-27 DIAGNOSIS — E669 Obesity, unspecified: Secondary | ICD-10-CM | POA: Diagnosis not present

## 2019-07-27 DIAGNOSIS — Z95810 Presence of automatic (implantable) cardiac defibrillator: Secondary | ICD-10-CM | POA: Diagnosis not present

## 2019-07-27 DIAGNOSIS — Z7901 Long term (current) use of anticoagulants: Secondary | ICD-10-CM | POA: Diagnosis not present

## 2019-07-27 DIAGNOSIS — Z79899 Other long term (current) drug therapy: Secondary | ICD-10-CM | POA: Insufficient documentation

## 2019-07-27 DIAGNOSIS — I472 Ventricular tachycardia: Secondary | ICD-10-CM | POA: Diagnosis not present

## 2019-07-27 DIAGNOSIS — I420 Dilated cardiomyopathy: Secondary | ICD-10-CM | POA: Diagnosis not present

## 2019-07-27 NOTE — Progress Notes (Signed)
Daily Session Note  Patient Details  Name: Gary Davila MRN: [id number] Date of Birth: May 11, 1990 Referring Provider:     Cardiac Rehab from 03/10/2019 in [facility] Cardiac and Pulmonary Rehab  Referring Provider  [place]      Encounter Date: 07/27/2019  Check In: Session Check In - 07/27/19 [place]      Check-In   Supervising physician immediately available to respond to emergencies  See telemetry face sheet for immediately available ER MD    Location  [facility]-Cardiac & Pulmonary Rehab    Staff Present  Renita Papa, RN Moises Blood, BS, ACSM CEP, Exercise Physiologist;Joseph Tessie Fass RCP,RRT,BSRT    Virtual Visit  No    Medication changes reported      No    Fall or balance concerns reported     No    Warm-up and Cool-down  Performed on first and last piece of equipment    Resistance Training Performed  Yes    VAD Patient?  No    PAD/SET Patient?  No      Pain Assessment   Currently in Pain?  No/denies          Social History   Tobacco Use  Smoking Status Never Smoker  Smokeless Tobacco Never Used    Goals Met:  Independence with exercise equipment Exercise tolerated well No report of cardiac concerns or symptoms Strength training completed today  Goals Unmet:  Not Applicable  Comments: Pt able to follow exercise prescription today without complaint.  Will continue to monitor for progression.    Dr. Emily Filbert is Medical Director for [place] and [facility].

## 2019-08-03 ENCOUNTER — Encounter: Payer: Medicare Other | Admitting: *Deleted

## 2019-08-03 ENCOUNTER — Other Ambulatory Visit: Payer: Self-pay

## 2019-08-03 DIAGNOSIS — I110 Hypertensive heart disease with heart failure: Secondary | ICD-10-CM | POA: Diagnosis not present

## 2019-08-03 DIAGNOSIS — I420 Dilated cardiomyopathy: Secondary | ICD-10-CM

## 2019-08-03 DIAGNOSIS — I5022 Chronic systolic (congestive) heart failure: Secondary | ICD-10-CM

## 2019-08-03 NOTE — Progress Notes (Signed)
Daily Session Note  Patient Details  Name: Gary Davila MRN: [id number] Date of Birth: [birth year] Referring Provider:     Cardiac Rehab from 03/10/2019 in [facility] Cardiac and Pulmonary Rehab  Referring Provider  [place]      Encounter Date: 08/03/2019  Check In: Session Check In - 08/03/19 1538      Check-In   Supervising physician immediately available to respond to emergencies  See telemetry face sheet for immediately available ER MD    Location  [facility]-Cardiac & Pulmonary Rehab    Staff Present  Renita Papa, RN Moises Blood, BS, ACSM CEP, Exercise Physiologist;Joseph Yates [place], [place], RCEP, CCRP, CCET    Virtual Visit  No    Medication changes reported      No    Fall or balance concerns reported     No    Warm-up and Cool-down  Performed on first and last piece of equipment    Resistance Training Performed  Yes    VAD Patient?  No    PAD/SET Patient?  No      Pain Assessment   Currently in Pain?  No/denies          Social History   Tobacco Use  Smoking Status Never Smoker  Smokeless Tobacco Never Used    Goals Met:  Independence with exercise equipment Exercise tolerated well No report of cardiac concerns or symptoms Strength training completed today  Goals Unmet:  Not Applicable  Comments: Pt able to follow exercise prescription today without complaint.  Will continue to monitor for progression.    Dr. Emily Filbert is Medical Director for [place] and [facility].

## 2019-08-06 ENCOUNTER — Encounter: Payer: Self-pay | Admitting: *Deleted

## 2019-08-06 ENCOUNTER — Telehealth: Payer: Self-pay | Admitting: *Deleted

## 2019-08-06 DIAGNOSIS — I5022 Chronic systolic (congestive) heart failure: Secondary | ICD-10-CM

## 2019-08-06 DIAGNOSIS — I420 Dilated cardiomyopathy: Secondary | ICD-10-CM

## 2019-08-06 NOTE — Telephone Encounter (Signed)
Matt's knee is swollen again and he will not be here.  Encouraged him to call doctor.

## 2019-08-10 ENCOUNTER — Encounter: Payer: Medicare Other | Admitting: *Deleted

## 2019-08-10 ENCOUNTER — Other Ambulatory Visit: Payer: Self-pay

## 2019-08-10 DIAGNOSIS — I420 Dilated cardiomyopathy: Secondary | ICD-10-CM

## 2019-08-10 DIAGNOSIS — I5022 Chronic systolic (congestive) heart failure: Secondary | ICD-10-CM

## 2019-08-10 DIAGNOSIS — I110 Hypertensive heart disease with heart failure: Secondary | ICD-10-CM | POA: Diagnosis not present

## 2019-08-10 NOTE — Progress Notes (Signed)
Daily Session Note  Patient Details  Name: Gary Davila MRN: [id number] Date of Birth: 10/18/1990 Referring Provider:     Cardiac Rehab from 03/10/2019 in [facility] Cardiac and Pulmonary Rehab  Referring Provider  [place]      Encounter Date: 08/10/2019  Check In: Session Check In - 08/10/19 1537      Check-In   Supervising physician immediately available to respond to emergencies  See telemetry face sheet for immediately available ER MD    Location  [facility]-Cardiac & Pulmonary Rehab    Staff Present  Renita Papa, RN BSN;Joseph [address] [place], [place], ACSM CEP, Exercise Physiologist    Virtual Visit  No    Medication changes reported      No    Fall or balance concerns reported     No    Warm-up and Cool-down  Performed on first and last piece of equipment    Resistance Training Performed  Yes    VAD Patient?  No    PAD/SET Patient?  No      Pain Assessment   Currently in Pain?  No/denies          Social History   Tobacco Use  Smoking Status Never Smoker  Smokeless Tobacco Never Used    Goals Met:  Independence with exercise equipment Exercise tolerated well No report of cardiac concerns or symptoms Strength training completed today  Goals Unmet:  Not Applicable  Comments: Pt able to follow exercise prescription today without complaint.  Will continue to monitor for progression.    Dr. Emily Filbert is Medical Director for [place] and [facility].

## 2019-08-12 ENCOUNTER — Encounter: Payer: Self-pay | Admitting: *Deleted

## 2019-08-12 ENCOUNTER — Encounter: Payer: Medicare Other | Admitting: *Deleted

## 2019-08-12 ENCOUNTER — Other Ambulatory Visit: Payer: Self-pay

## 2019-08-12 DIAGNOSIS — I420 Dilated cardiomyopathy: Secondary | ICD-10-CM

## 2019-08-12 DIAGNOSIS — I110 Hypertensive heart disease with heart failure: Secondary | ICD-10-CM | POA: Diagnosis not present

## 2019-08-12 DIAGNOSIS — I5022 Chronic systolic (congestive) heart failure: Secondary | ICD-10-CM

## 2019-08-12 NOTE — Progress Notes (Signed)
Daily Session Note  Patient Details  Name: Gary Davila MRN: [id number] Date of Birth: [birth year] Referring Provider:     Cardiac Rehab from 03/10/2019 in [facility]  Referring Provider  [place]      Encounter Date: 08/12/2019  Check In: Session Check In - 08/12/19 1527      Check-In   Supervising physician immediately available to respond to emergencies  See telemetry face sheet for immediately available ER MD    Location  [facility]-Cardiac [facility] Pulmonary Rehab    Staff Present  Renita Papa, RN BSN;Melissa Caiola RDN, Rowe Pavy, BA, ACSM CEP, Exercise Physiologist    Virtual Visit  No    Medication changes reported      No    Fall or balance concerns reported     No    Warm-up and Cool-down  Performed on first and last piece of equipment    Resistance Training Performed  Yes    VAD Patient?  No    PAD/SET Patient?  No      Pain Assessment   Currently in Pain?  No/denies          Social History   Tobacco Use  Smoking Status Never Smoker  Smokeless Tobacco Never Used    Goals Met:  Independence with exercise equipment Exercise tolerated well No report of cardiac concerns or symptoms Strength training completed today  Goals Unmet:  Not Applicable  Comments: Pt able to follow exercise prescription today without complaint.  Will continue to monitor for progression.    Dr. Emily Filbert is Medical Director for [facility].

## 2019-08-12 NOTE — Progress Notes (Signed)
Cardiac Individual Treatment Plan  Patient Details  Name: Gary Davila MRN: [id number] Date of Birth: 03-Aug-1990 Referring Provider:     Cardiac Rehab from 03/10/2019 in [facility] Cardiac and Pulmonary Rehab  Referring Provider  Devore      Initial Encounter Date:    Cardiac Rehab from 03/10/2019 in [facility] Cardiac and Pulmonary Rehab  Date  03/10/19      Visit Diagnosis: Heart failure, chronic systolic ([place])  Dilated cardiomyopathy ([place])  Patient's Home Medications on Admission:  Current Outpatient Medications:  .  carvedilol (COREG) 25 MG tablet, TAKE (1) TABLET TWICE A DAY., Disp: , Rfl:  .  diclofenac Sodium (VOLTAREN) 1 % GEL, Apply 2 g topically 3 (three) times dailyas needed., Disp: 100 g, Rfl: 1 .  digoxin (LANOXIN) 0.125 MG tablet, TAKE 1 TABLET DAILY., Disp: , Rfl:  .  empagliflozin (JARDIANCE) 10 MG TABS tablet, Take by mouth., Disp: , Rfl:  .  eplerenone (INSPRA) 25 MG tablet, TAKE 1 TABLET DAILY., Disp: , Rfl:  .  furosemide (LASIX) 20 MG tablet, Take 40 mg by mouth 2 (two) times daily., Disp: , Rfl:  .  ibuprofen (ADVIL,MOTRIN) 200 MG tablet, Take by mouth., Disp: , Rfl:  .  warfarin (COUMADIN) 5 MG tablet, Take 7.56m (one and half pill of 522m on Tues / Thurs, then 3m4mvery other day., Disp: , Rfl:   Past Medical History: Past Medical History:  Diagnosis Date  . A-fib ([facility] . Chronic CHF (congestive heart failure) ([place] . Coarctation of aorta   . Dilated cardiomyopathy ([place] . Hypertension   . ICD (implantable cardioverter-defibrillator) in place   . ICD (implantable cardioverter-defibrillator) lead failure   . Infection of pacemaker pocket ([place] . Left ventricular diastolic dysfunction, NYHA class 2   . Obesity   . Ventricular tachycardia (HCC)     Tobacco Use: Social History   Tobacco Use  Smoking Status Never Smoker  Smokeless Tobacco Never Used    Labs: Recent Review Flowsheet Data    There is no flowsheet data to display.        Exercise Target Goals: Exercise Program Goal: Individual exercise prescription set using results from initial 6 min walk test and THRR while considering  patient's activity barriers and safety.   Exercise Prescription Goal: Initial exercise prescription builds to 30-45 minutes a day of aerobic activity, 2-3 days per week.  Home exercise guidelines will be given to patient during program as part of exercise prescription that the participant will acknowledge.   Education: Aerobic Exercise & Resistance Training: - Gives group verbal and written instruction on the various components of exercise. Focuses on aerobic and resistive training programs and the benefits of this training and how to safely progress through these programs..   Education: Exercise & Equipment Safety: - Individual verbal instruction and demonstration of equipment use and safety with use of the equipment.   [facility]  Date  03/10/19  Educator  AS  Instruction Review Code  1- Verbalizes Understanding      Education: Exercise Physiology & General Exercise Guidelines: - Group verbal and written instruction with models to review the exercise physiology of the cardiovascular system and associated critical values. Provides general exercise guidelines with specific guidelines to those with heart or lung disease.    Education: Flexibility, Balance, Mind/Body Relaxation: Provides group verbal/written instruction on the benefits of flexibility and balance training, including mind/body exercise  modes such as yoga, pilates and tai chi.  Demonstration and skill practice provided.   Cardiac Rehab from 03/26/2019 in [facility] Cardiac and Pulmonary Rehab  Date  03/26/19  Educator  AS  Instruction Review Code  1- Verbalizes Understanding      Activity Barriers & Risk Stratification: Activity Barriers & Cardiac Risk Stratification - 02/25/19 1512      Activity Barriers &  Cardiac Risk Stratification   Activity Barriers  Deconditioning;Balance Concerns;Assistive Device;Muscular Weakness;Shortness of Breath;Other (comment);History of Falls    Comments  problems with knees and ankles from possible nueropathy    Cardiac Risk Stratification  High       6 Minute Walk: 6 Minute Walk    Row Name 03/10/19 1653         6 Minute Walk   Distance  430 feet     Walk Time  6 minutes     # of Rest Breaks  0     MPH  0.8     METS  3.04     RPE  11     Perceived Dyspnea   0     VO2 Peak  10.65     Symptoms  Yes (comment)     Comments  foot pain - plantar fascitis 3/10     Resting HR  50 bpm     Resting BP  94/52     Resting Oxygen Saturation   99 %     Exercise Oxygen Saturation  during 6 min walk  97 %     Max Ex. HR  84 bpm     Max Ex. BP  94/54        Oxygen Initial Assessment:   Oxygen Re-Evaluation:   Oxygen Discharge (Final Oxygen Re-Evaluation):   Initial Exercise Prescription: Initial Exercise Prescription - 03/10/19 1700      Date of Initial Exercise RX and Referring Provider   Date  03/10/19    Referring Provider  Devore      Treadmill   MPH  0.5    Grade  0    Minutes  15      Recumbant Bike   Level  1    RPM  60    Minutes  15    METs  3      NuStep   Level  2    SPM  80    Minutes  15    METs  3      Arm Ergometer   Level  1    RPM  25    Minutes  15    METs  3      REL-XR   Level  2    Speed  50    Minutes  15    METs  3      T5 Nustep   Level  1    SPM  80    Minutes  15    METs  3      Biostep-RELP   Level  2    SPM  50    Minutes  15    METs  3      Prescription Details   Frequency (times per week)  3    Duration  Progress to 30 minutes of continuous aerobic without signs/symptoms of physical distress      Intensity   THRR 40-80% of Max Heartrate  106-162    Ratings of Perceived Exertion  11-13    Perceived  Dyspnea  0-4      Resistance Training   Training Prescription  Yes    Weight  3  lb    Reps  10-15       Perform Capillary Blood Glucose checks as needed.  Exercise Prescription Changes: Exercise Prescription Changes    Row Name 03/10/19 1700 04/02/19 1400 04/13/19 1500 04/15/19 1200 04/30/19 1500     Response to Exercise   Blood Pressure (Admit)  94/52  94/50  --  88/62  98/58   Blood Pressure (Exercise)  94/54  92/66  --  102/62  100/62   Blood Pressure (Exit)  --  94/60  --  104/54  98/60   Heart Rate (Admit)  50 bpm  66 bpm  --  79 bpm  64 bpm   Heart Rate (Exercise)  84 bpm  95 bpm  --  96 bpm  96 bpm   Heart Rate (Exit)  72 bpm  73 bpm  --  76 bpm  61 bpm   Oxygen Saturation (Admit)  99 %  --  --  --  --   Oxygen Saturation (Exercise)  97 %  --  --  --  --   Rating of Perceived Exertion (Exercise)  11  11  --  13  11   Perceived Dyspnea (Exercise)  0  --  --  --  --   Symptoms  leg/foot  pain  plantar fascitis  --  --  none  none   Duration  --  --  --  Continue with 30 min of aerobic exercise without signs/symptoms of physical distress.  Continue with 30 min of aerobic exercise without signs/symptoms of physical distress.   Intensity  --  --  --  THRR unchanged  THRR unchanged     Progression   Progression  --  Continue to progress workloads to maintain intensity without signs/symptoms of physical distress.  --  Continue to progress workloads to maintain intensity without signs/symptoms of physical distress.  Continue to progress workloads to maintain intensity without signs/symptoms of physical distress.   Average METs  --  1.8  --  1.63  1.4     Resistance Training   Training Prescription  --  Yes  --  Yes  Yes   Weight  --  3 lb  --  3 lb  3 lb   Reps  --  10-15  --  10-15  --     Interval Training   Interval Training  --  No  --  No  No     Treadmill   MPH  --  0.5  --  --  --   Grade  --  0  --  --  --   Minutes  --  15  --  --  --     NuStep   Level  --  2  --  3  4   SPM  --  80  --  --  80   Minutes  --  15  --  15  15   METs  --  2.1   --  2  1.2     Arm Ergometer   Level  --  1  --  1  --   RPM  --  25  --  --  --   Minutes  --  15  --  15  --   METs  --  1.7  --  1.5  --  REL-XR   Level  --  --  --  1  1   Speed  --  --  --  --  50   Minutes  --  --  --  15  15   METs  --  --  --  1.4  1.1     Biostep-RELP   Level  --  2  --  --  --   SPM  --  50  --  --  --   Minutes  --  15  --  --  --   METs  --  2  --  --  --     Home Exercise Plan   Plans to continue exercise at  --  --  Home (comment) walking, staff videos  Home (comment) walking, staff videos  --   Frequency  --  --  Add 3 additional days to program exercise sessions.  Add 3 additional days to program exercise sessions.  --   Initial Home Exercises Provided  --  --  04/13/19  04/13/19  --   [place] Name 05/14/19 1400 05/29/19 0900 06/11/19 0900 06/25/19 1100 07/08/19 1400     Response to Exercise   Blood Pressure (Admit)  92/62  90/56  102/62  90/62  88/58   Blood Pressure (Exercise)  104/62  110/66  98/60  134/82  124/70   Blood Pressure (Exit)  90/54  96/66  90/60  100/62  110/70   Heart Rate (Admit)  71 bpm  90 bpm  65 bpm  56 bpm  67 bpm   Heart Rate (Exercise)  95 bpm  130 bpm  99 bpm  139 bpm  108 bpm   Heart Rate (Exit)  93 bpm  104 bpm  81 bpm  50 bpm  76 bpm   Rating of Perceived Exertion (Exercise)  _0 Symptoms  none  none  knee swollen this week  --  none   Duration  Continue with 30 min of aerobic exercise without signs/symptoms of physical distress.  Continue with 30 min of aerobic exercise without signs/symptoms of physical distress.  Continue with 30 min of aerobic exercise without signs/symptoms of physical distress.  Continue with 30 min of aerobic exercise without signs/symptoms of physical distress.  Continue with 30 min of aerobic exercise without signs/symptoms of physical distress.   Intensity  THRR unchanged  THRR unchanged  THRR unchanged  THRR unchanged  THRR unchanged     Progression   Progression  Continue  to progress workloads to maintain intensity without signs/symptoms of physical distress.  Continue to progress workloads to maintain intensity without signs/symptoms of physical distress.  Continue to progress workloads to maintain intensity without signs/symptoms of physical distress.  Continue to progress workloads to maintain intensity without signs/symptoms of physical distress.  Continue to progress workloads to maintain intensity without signs/symptoms of physical distress.   Average METs  1.6  2  1.72  1.8  2.12     Resistance Training   Training Prescription  Yes  Yes  Yes  Yes  Yes   Weight  3 lb  3 lb  3 lb  3 lb  3 lb   Reps  10-15  10-15  10-15  10-15  10-15     Interval Training   Interval Training  No  No  No  No  No     Treadmill   MPH  --  1.5  1.5  1.5  1.5   Grade  --  0  0  0  0   Minutes  --  _0 METs  --  --  2.15  2.15  2.15     Recumbant Elliptical   Level  --  --  1  --  --   Minutes  --  --  15  --  --   METs  --  --  1.2  --  --     Elliptical   Level  --  --  1  --  --   Speed  --  --  2  --  --   Minutes  --  --  4  --  --     REL-XR   Level  --  --  --  1  1   Minutes  --  --  --  15  15   METs  --  --  --  1.4  2.1     T5 Nustep   Level  --  1  1  --  --   SPM  --  80  --  --  --   Minutes  --  15  15  --  --   METs  --  1.8  1.8  --  --     Home Exercise Plan   Plans to continue exercise at  Home (comment) walking, staff videos  Home (comment) walking, staff videos  Home (comment) walking, staff videos  Home (comment) walking, staff videos  Home (comment) walking, staff videos   Frequency  Add 3 additional days to program exercise sessions.  Add 3 additional days to program exercise sessions.  Add 3 additional days to program exercise sessions.  Add 3 additional days to program exercise sessions.  Add 3 additional days to program exercise sessions.   Initial Home Exercises Provided  04/13/19  04/13/19  04/13/19  04/13/19  04/13/19    Row Name 08/06/19 1300             Response to Exercise   Blood Pressure (Admit)  96/54       Blood Pressure (Exercise)  130/80       Blood Pressure (Exit)  104/62       Heart Rate (Admit)  68 bpm       Heart Rate (Exercise)  96 bpm       Heart Rate (Exit)  79 bpm       Rating of Perceived Exertion (Exercise)  11       Symptoms  none       Duration  Continue with 30 min of aerobic exercise without signs/symptoms of physical distress.       Intensity  THRR unchanged         Progression   Progression  Continue to progress workloads to maintain intensity without signs/symptoms of physical distress.       Average METs  2.08         Resistance Training   Training Prescription  Yes       Weight  3 lb       Reps  10-15         Interval Training   Interval Training  No         Treadmill   MPH  1.5       Grade  0       Minutes  15       METs  2.15         Recumbant Bike   Level  1       Minutes  15         NuStep   Level  4       Minutes  15       METs  2         Recumbant Elliptical   Level  1       Minutes  15         Home Exercise Plan   Plans to continue exercise at  Home (comment) walking, staff videos       Frequency  Add 3 additional days to program exercise sessions.       Initial Home Exercises Provided  04/13/19          Exercise Comments: Exercise Comments    Row Name 03/16/19 1536           Exercise Comments  First full day of exercise!  Patient was oriented to gym and equipment including functions, settings, policies, and procedures.  Patient's individual exercise prescription and treatment plan were reviewed.  All starting workloads were established based on the results of the 6 minute walk test done at initial orientation visit.  The plan for exercise progression was also introduced and progression will be customized based on patient's performance and goals.          Exercise Goals and Review: Exercise Goals    Row Name 03/10/19 1704              Exercise Goals   Increase Physical Activity  Yes       Intervention  Provide advice, education, support and counseling about physical activity/exercise needs.;Develop an individualized exercise prescription for aerobic and resistive training based on initial evaluation findings, risk stratification, comorbidities and participant's personal goals.       Expected Outcomes  Short Term: Attend rehab on a regular basis to increase amount of physical activity.;Long Term: Add in home exercise to make exercise part of routine and to increase amount of physical activity.;Long Term: Exercising regularly at least 3-5 days a week.       Increase Strength and Stamina  Yes       Intervention  Provide advice, education, support and counseling about physical activity/exercise needs.;Develop an individualized exercise prescription for aerobic and resistive training based on initial evaluation findings, risk stratification, comorbidities and participant's personal goals.       Expected Outcomes  Short Term: Increase workloads from initial exercise prescription for resistance, speed, and METs.;Short Term: Perform resistance training exercises routinely during rehab and add in resistance training at home;Long Term: Improve cardiorespiratory fitness, muscular endurance and strength as measured by increased METs and functional capacity (6MWT)       Able to understand and use rate of perceived exertion (RPE) scale  Yes       Intervention  Provide education and explanation on how to use RPE scale       Expected Outcomes  Short Term: Able to use RPE daily in rehab to express subjective intensity level;Long Term:  Able to use RPE to guide intensity level when exercising independently       Knowledge and understanding of Target Heart Rate Range (THRR)  Yes       Intervention  Provide education and explanation of THRR including how the numbers were predicted and where they are located for reference  Expected  Outcomes  Short Term: Able to state/look up THRR;Short Term: Able to use daily as guideline for intensity in rehab;Long Term: Able to use THRR to govern intensity when exercising independently       Understanding of Exercise Prescription  Yes       Intervention  Provide education, explanation, and written materials on patient's individual exercise prescription       Expected Outcomes  Short Term: Able to explain program exercise prescription;Long Term: Able to explain home exercise prescription to exercise independently          Exercise Goals Re-Evaluation : Exercise Goals Re-Evaluation    Row Name 03/16/19 1537 04/02/19 1456 04/13/19 1527 04/30/19 1528 05/14/19 1353     Exercise Goal Re-Evaluation   Exercise Goals Review  Increase Physical Activity;Knowledge and understanding of Target Heart Rate Range (THRR);Able to check pulse independently;Understanding of Exercise Prescription;Able to understand and use rate of perceived exertion (RPE) scale  Increase Physical Activity;Increase Strength and Stamina;Able to understand and use rate of perceived exertion (RPE) scale;Able to understand and use Dyspnea scale;Knowledge and understanding of Target Heart Rate Range (THRR);Understanding of Exercise Prescription  Increase Physical Activity;Increase Strength and Stamina;Able to understand and use rate of perceived exertion (RPE) scale;Able to understand and use Dyspnea scale;Knowledge and understanding of Target Heart Rate Range (THRR);Understanding of Exercise Prescription;Able to check pulse independently  Increase Physical Activity;Increase Strength and Stamina;Able to understand and use rate of perceived exertion (RPE) scale;Able to understand and use Dyspnea scale;Knowledge and understanding of Target Heart Rate Range (THRR);Understanding of Exercise Prescription  Increase Physical Activity;Increase Strength and Stamina;Understanding of Exercise Prescription   Comments  Reviewed RPE scale, THR and  program prescription with pt today.  Pt voiced understanding and was given a copy of goals to take home.  [name] is doing well in rehab.  he has had some ankle pain but continues to attend and try all machines.  Staff will monitor progess.  [name] is doing well in rehab.  He has started to be able to move more at home.  He has no real routine going.  Reviewed home exercise with pt today.  Pt plans to walk and use staff videos at home for exercise.  Reviewed THR, pulse, RPE, sign and symptoms, and when to call 911 or MD.  Also discussed weather considerations and indoor options.  Pt voiced understanding.  Matt has increased to 4 lb weight with one hand - other wrist is sprained and he is using 2 lb wieght until it feels better.  He did get new shoes and says he hasnt ad any new foot pain.  [name] is doing well.  He does his daily stretches and walks some with his mom.  He has been able to be more active around the house.  We talked about walking every day at home.  We talked about a goal of walking down here.  Overall, stamina is improving.   Expected Outcomes  Short: Use RPE daily to regulate intensity. Long: Follow program prescription in THR.  Short - conitnue to attend consistently Long - improve stamina  Short: Start to add in exercise at home.   Long: Continue to improve stamina.  Short - exercise consistently on his own Long : improve overall stamina  Short: Walk more at home and starting walking to class.  Long:  Continue to improve stamina.   [place] Name 05/29/19 7989 06/11/19 0920 06/15/19 1541 06/25/19 1126 07/08/19 1406     Exercise Goal Re-Evaluation  Exercise Goals Review  Increase Physical Activity;Able to understand and use rate of perceived exertion (RPE) scale;Knowledge and understanding of Target Heart Rate Range (THRR);Understanding of Exercise Prescription;Increase Strength and Stamina;Able to understand and use Dyspnea scale;Able to check pulse independently  Increase Physical Activity;Increase  Strength and Stamina;Understanding of Exercise Prescription  Increase Physical Activity;Increase Strength and Stamina;Understanding of Exercise Prescription  Increase Physical Activity;Increase Strength and Stamina;Able to understand and use rate of perceived exertion (RPE) scale;Able to understand and use Dyspnea scale;Knowledge and understanding of Target Heart Rate Range (THRR);Able to check pulse independently;Understanding of Exercise Prescription  Increase Physical Activity;Increase Strength and Stamina;Understanding of Exercise Prescription   Comments  [name] states he stamina is getting better.  He can walk up stairs at home unassisted and can stand the whole time in the shower.  He walks outside at home.  [name] has been doing better in rehab.  He has actually been able to tackle the elliptical some.  He was not able to increase this week as his knee swelled up and he was not able to walk as well.  We will continue to monitor his progress.  [name] is doing well in rehab.  He is getting in some exercise/activity by going up and down stairs and feeding dog.  His goal was to walk to and from rehab and he is now doing it!  [name] has stated he feels he is getting stronger.  He does continue to walk down, exercise and walk back up.  [name] continues to improve in rehab.  He is up to 2.1 METs on the XR.  We will continue to monitor his progress.   Expected Outcomes  Short - continue to be active at home Long : walk down to rehab gym before and after exercise  Short: Increase speed on treadmill and seated equipment.  Long: Continue to improve stamina.  Short: More activity at home.  Long: Continue to improve stamina.  Short:  more active at home Long: improve overall stamina  Short: Continue to try to increase speed on treadmill  Long: Continue to move more at home.   [place] Name 07/13/19 1548 07/22/19 1536 08/03/19 1604 08/06/19 1347       Exercise Goal Re-Evaluation   Exercise Goals Review  Increase Physical  Activity;Increase Strength and Stamina  --  Increase Physical Activity;Increase Strength and Stamina;Understanding of Exercise Prescription  Increase Physical Activity;Increase Strength and Stamina;Understanding of Exercise Prescription    Comments  [name] has got resistance bands that he obtained from rehab and is able to do some stretches at home. When he is done with the program matt is thinking about going to [facility] [employer].  Out since last review with swollen knee  [name] has returned to class and his knee is feeling better.  He has been using his exercise band at home and walking some at home.  His strength is improving.  [name] continues ot improve and admitted on phone today that he can tell that rehab is helping him.  He is now able to walk down here and more on the treadmill.  His feet and knee are his biggest limitation. We will continue to monitor his progress.    Expected Outcomes  Short: due more home exercises. Long: Work on exercise at [place]: Continue to add in more exercise at home.  Long; Continue to improve stamina.  Short: Continue to add more time on treadmill Long: Continue to improve stamina.  Discharge Exercise Prescription (Final Exercise Prescription Changes): Exercise Prescription Changes - 08/06/19 1300      Response to Exercise   Blood Pressure (Admit)  96/54    Blood Pressure (Exercise)  130/80    Blood Pressure (Exit)  104/62    Heart Rate (Admit)  68 bpm    Heart Rate (Exercise)  96 bpm    Heart Rate (Exit)  79 bpm    Rating of Perceived Exertion (Exercise)  11    Symptoms  none    Duration  Continue with 30 min of aerobic exercise without signs/symptoms of physical distress.    Intensity  THRR unchanged      Progression   Progression  Continue to progress workloads to maintain intensity without signs/symptoms of physical distress.    Average METs  2.08      Resistance Training   Training Prescription  Yes    Weight  3 lb    Reps  10-15       Interval Training   Interval Training  No      Treadmill   MPH  1.5    Grade  0    Minutes  15    METs  2.15      Recumbant Bike   Level  1    Minutes  15      NuStep   Level  4    Minutes  15    METs  2      Recumbant Elliptical   Level  1    Minutes  15      Home Exercise Plan   Plans to continue exercise at  Home (comment)   walking, staff videos   Frequency  Add 3 additional days to program exercise sessions.    Initial Home Exercises Provided  04/13/19       Nutrition:  Target Goals: Understanding of nutrition guidelines, daily intake of sodium <1525m, cholesterol <2043m calories 30% from fat and 7% or less from saturated fats, daily to have 5 or more servings of fruits and vegetables.  Education: Controlling Sodium/Reading Food Labels -Group verbal and written material supporting the discussion of sodium use in heart healthy nutrition. Review and explanation with models, verbal and written materials for utilization of the food label.   Education: General Nutrition Guidelines/Fats and Fiber: -Group instruction provided by verbal, written material, models and posters to present the general guidelines for heart healthy nutrition. Gives an explanation and review of dietary fats and fiber.   Biometrics:    Nutrition Therapy Plan and Nutrition Goals: Nutrition Therapy & Goals - 04/02/19 1102      Nutrition Therapy   Diet  Low Na, HH diet    Drug/Food Interactions  Coumadin/Vit K    Protein (specify units)  75g    Fiber  30 grams    Whole Grain Foods  3 servings    Saturated Fats  12 max. grams    Fruits and Vegetables  5 servings/day    Sodium  1.5 grams      Personal Nutrition Goals   Nutrition Goal  ST: switch out one glass of sugar-sweetened beverage with water LT: increase strength and muscle mass    Comments  B: bowl of cereal cheerios, cinnamon toast crunch, rice krispies (whole milk). L: chicken with vegetables sometimes D: varies (meat,  starch, vegetable) - red meat or poultry. Drinks: sweet tea or soda. 2-3 glasses/can. Discussed HH eating and MNT for muscle building.  Intervention Plan   Intervention  Prescribe, educate and counsel regarding individualized specific dietary modifications aiming towards targeted core components such as weight, hypertension, lipid management, diabetes, heart failure and other comorbidities.;Nutrition handout(s) given to patient.    Expected Outcomes  Short Term Goal: Understand basic principles of dietary content, such as calories, fat, sodium, cholesterol and nutrients.;Short Term Goal: A plan has been developed with personal nutrition goals set during dietitian appointment.;Long Term Goal: Adherence to prescribed nutrition plan.       Nutrition Assessments:   MEDIFICTS Score Key:          ?70 Need to make dietary changes          40-70 Heart Healthy Diet         ? 40 Therapeutic Level Cholesterol Diet  Nutrition Goals Re-Evaluation: Nutrition Goals Re-Evaluation    [place] Name 04/30/19 1346 06/08/19 1608 07/09/19 1712         Goals   Nutrition Goal  ST: switch out one glass of sugar-sweetened beverage with water LT: increase strength and muscle mass  ST: switch out one glass of sugar-sweetened beverage with water LT: increase strength and muscle mass  ST: switch out one glass of sugar-sweetened beverage with water LT: increase strength and muscle mass     Comment  Pt reports thinks his strength is coming back.  Some of the time is switching, pt reports he just needs to get used to it and will continue to work on it. Pt reports strength is ok when legs arent in pain.  Continue with current changes     Expected Outcome  --  ST: switch out one glass of sugar-sweetened beverage with water LT: increase strength and muscle mass  ST: switch out one glass of sugar-sweetened beverage with water LT: increase strength and muscle mass        Nutrition Goals Discharge (Final Nutrition Goals  Re-Evaluation): Nutrition Goals Re-Evaluation - 07/09/19 1712      Goals   Nutrition Goal  ST: switch out one glass of sugar-sweetened beverage with water LT: increase strength and muscle mass    Comment  Continue with current changes    Expected Outcome  ST: switch out one glass of sugar-sweetened beverage with water LT: increase strength and muscle mass       Psychosocial: Target Goals: Acknowledge presence or absence of significant depression and/or stress, maximize coping skills, provide positive support system. Participant is able to verbalize types and ability to use techniques and skills needed for reducing stress and depression.   Education: Depression - Provides group verbal and written instruction on the correlation between heart/lung disease and depressed mood, treatment options, and the stigmas associated with seeking treatment.   Education: Sleep Hygiene -Provides group verbal and written instruction about how sleep can affect your health.  Define sleep hygiene, discuss sleep cycles and impact of sleep habits. Review good sleep hygiene tips.     Education: Stress and Anxiety: - Provides group verbal and written instruction about the health risks of elevated stress and causes of high stress.  Discuss the correlation between heart/lung disease and anxiety and treatment options. Review healthy ways to manage with stress and anxiety.    Initial Review & Psychosocial Screening: Initial Psych Review & Screening - 02/25/19 1514      Initial Review   Current issues with  Current Stress Concerns    Source of Stress Concerns  Chronic Illness    Comments  Been dealing with heart issues  since birth, first noted in 2005      [place]?  Yes   mom, aunt   Concerns  Recent loss of significant other    Comments  Recently lost father in August      Barriers   Psychosocial barriers to participate in program  The patient should benefit from training in  stress management and relaxation.;Psychosocial barriers identified (see note)      Screening Interventions   Interventions  Encouraged to exercise;To provide support and resources with identified psychosocial needs;Provide feedback about the scores to participant    Expected Outcomes  Short Term goal: Utilizing psychosocial counselor, staff and physician to assist with identification of specific Stressors or current issues interfering with healing process. Setting desired goal for each stressor or current issue identified.;Long Term Goal: Stressors or current issues are controlled or eliminated.;Short Term goal: Identification and review with participant of any Quality of Life or Depression concerns found by scoring the questionnaire.;Long Term goal: The participant improves quality of Life and PHQ9 Scores as seen by post scores and/or verbalization of changes       Quality of Life Scores:  Quality of Life - 03/10/19 1706      Quality of Life   Select  Quality of Life      Quality of Life Scores   Health/Function Pre  20.43 %    Socioeconomic Pre  23.29 %    Psych/Spiritual Pre  21.83 %    Family Pre  26.67 %    GLOBAL Pre  21.95 %      Scores of 19 and below usually indicate a poorer quality of life in these areas.  A difference of  2-3 points is a clinically meaningful difference.  A difference of 2-3 points in the total score of the Quality of Life Index has been associated with significant improvement in overall quality of life, self-image, physical symptoms, and general health in studies assessing change in quality of life.  PHQ-9: Recent Review Flowsheet Data    Depression screen [facility] 2/9 04/29/2019 03/10/2019 03/04/2019   Decreased Interest 0 0 0   Down, Depressed, Hopeless 0 0 0   PHQ - 2 Score 0 0 0   Altered sleeping - 0 -   Tired, decreased energy - 1 -   Change in appetite - 0 -   Feeling bad or failure about yourself  - 0 -   Trouble concentrating - 0 -   Moving slowly or  fidgety/restless - 3 -   Suicidal thoughts - 0 -   PHQ-9 Score - 4 -   Difficult doing work/chores - Somewhat difficult -     Interpretation of Total Score  Total Score Depression Severity:  1-4 = Minimal depression, 5-9 = Mild depression, 10-14 = Moderate depression, 15-19 = Moderately severe depression, 20-27 = Severe depression   Psychosocial Evaluation and Intervention:   Psychosocial Re-Evaluation: Psychosocial Re-Evaluation    Row Name 04/13/19 1530 05/14/19 1355 06/15/19 1543 07/13/19 1551 08/03/19 1606     Psychosocial Re-Evaluation   Current issues with  Current Depression;Current Stress Concerns  Current Depression;Current Stress Concerns  Current Depression;Current Stress Concerns  None Identified  Current Stress Concerns   Comments  Matt's family is very involved in his life.  His aunt has started to come to class with him, but we will try to keep them separated to let him have some time to himself.  She tried to get him to  get a cortizone shot for his wrist he injured.  He is starting to want some more independence and has been more active at home.  Overall, he is feeling better overall.  [name] is doing better overall.  He is still being pulled around by his aunt and she is causing some tension.  He is also trying  to watch out for his mom as well. He denies depression or anxiety symptoms.  He is already planning to join gym at [employer].  [name] is doing well in rehab. He is glad to have some freedom from his aunt.  He and his mom are doing okay.  He needs to be more active and get out of house.  He wants to get back to his house.  He is going to start to take the steps to move back out on his own.  [name] is trying to stay positive in Cardiac rehab. He knows why he has to do rehab to keep his heart healthy and decrease stress. He states that he has no stressors at this time.  [name] is doing fairly well in rehab.  His aunt is still bothering his mom which upsets.  His dog recently  passed which had him saddened.  They have someone drive through their yard and police showing up which got his mom scared so he was not able to feel like he could move out yet.  He is sleeping pretty good.   Expected Outcomes  Short: Continue to attend class to build indepence  Long: Continue stay positive.  Short: Continue to attend class to build indepence  Long: Continue stay positive.  Short: Add more exercise to build stamina. Long: Move out!  Short: Attend [facility] stress management education to decrease stress. Long: Maintain exercise Post [facility] to keep stress at a minimum.  Short: Continue to cope with aunt positively  Long: Continue to exercise for more independence.   Interventions  Encouraged to attend Cardiac Rehabilitation for the exercise  Encouraged to attend Cardiac Rehabilitation for the exercise  Encouraged to attend Cardiac Rehabilitation for the exercise  Encouraged to attend Cardiac Rehabilitation for the exercise  Encouraged to attend Cardiac Rehabilitation for the exercise   Continue Psychosocial Services   Follow up required by staff  --  Follow up required by staff  Follow up required by staff  --   Comments  Been dealing with heart issues since birth, first noted in 2005  --  --  --  --     Initial Review   Source of Stress Concerns  Chronic Illness  --  --  --  --      Psychosocial Discharge (Final Psychosocial Re-Evaluation): Psychosocial Re-Evaluation - 08/03/19 1606      Psychosocial Re-Evaluation   Current issues with  Current Stress Concerns    Comments  [name] is doing fairly well in rehab.  His aunt is still bothering his mom which upsets.  His dog recently passed which had him saddened.  They have someone drive through their yard and police showing up which got his mom scared so he was not able to feel like he could move out yet.  He is sleeping pretty good.    Expected Outcomes  Short: Continue to cope with aunt positively  Long: Continue to exercise for more  independence.    Interventions  Encouraged to attend Cardiac Rehabilitation for the exercise       Vocational Rehabilitation: Provide vocational rehab assistance to qualifying candidates.  Vocational Rehab Evaluation & Intervention: Vocational Rehab - 02/25/19 1516      Initial Vocational Rehab Evaluation & Intervention   Assessment shows need for Vocational Rehabilitation  No       Education: Education Goals: Education classes will be provided on a variety of topics geared toward better understanding of heart health and risk factor modification. Participant will state understanding/return demonstration of topics presented as noted by education test scores.  Learning Barriers/Preferences: Learning Barriers/Preferences - 02/25/19 1513      Learning Barriers/Preferences   Learning Barriers  None    Learning Preferences  Pictoral       General Cardiac Education Topics:  AED/CPR: - Group verbal and written instruction with the use of models to demonstrate the basic use of the AED with the basic ABC's of resuscitation.   Anatomy & Physiology of the Heart: - Group verbal and written instruction and models provide basic cardiac anatomy and physiology, with the coronary electrical and arterial systems. Review of Valvular disease and Heart Failure   Cardiac Procedures: - Group verbal and written instruction to review commonly prescribed medications for heart disease. Reviews the medication, class of the drug, and side effects. Includes the steps to properly store meds and maintain the prescription regimen. (beta blockers and nitrates)   Cardiac Medications I: - Group verbal and written instruction to review commonly prescribed medications for heart disease. Reviews the medication, class of the drug, and side effects. Includes the steps to properly store meds and maintain the prescription regimen.   Cardiac Medications II: -Group verbal and written instruction to review commonly  prescribed medications for heart disease. Reviews the medication, class of the drug, and side effects. (all other drug classes)    Go Sex-Intimacy & Heart Disease, Get SMART - Goal Setting: - Group verbal and written instruction through game format to discuss heart disease and the return to sexual intimacy. Provides group verbal and written material to discuss and apply goal setting through the application of the S.M.A.R.T. Method.   Other Matters of the Heart: - Provides group verbal, written materials and models to describe Stable Angina and Peripheral Artery. Includes description of the disease process and treatment options available to the cardiac patient.   Infection Prevention: - Provides verbal and written material to individual with discussion of infection control including proper hand washing and proper equipment cleaning during exercise session.   Cardiac Rehab from 03/26/2019 in [facility] Cardiac and Pulmonary Rehab  Date  03/10/19  Educator  AS  Instruction Review Code  1- Verbalizes Understanding      Falls Prevention: - Provides verbal and written material to individual with discussion of falls prevention and safety.   Cardiac Rehab from 03/26/2019 in [facility] Cardiac and Pulmonary Rehab  Date  03/10/19  Educator  AS  Instruction Review Code  1- Verbalizes Understanding      Other: -Provides group and verbal instruction on various topics (see comments)   Knowledge Questionnaire Score:   Core Components/Risk Factors/Patient Goals at Admission: Personal Goals and Risk Factors at Admission - 03/10/19 1706      Core Components/Risk Factors/Patient Goals on Admission    Weight Management  Yes;Obesity;Weight Loss    Intervention  Weight Management: Develop a combined nutrition and exercise program designed to reach desired caloric intake, while maintaining appropriate intake of nutrient and fiber, sodium and fats, and appropriate energy expenditure required for the weight  goal.;Weight Management: Provide education and appropriate resources to help participant work on and attain dietary  goals.;Weight Management/Obesity: Establish reasonable short term and long term weight goals.;Obesity: Provide education and appropriate resources to help participant work on and attain dietary goals.    Admit Weight  199 lb 8 oz (90.5 kg)    Goal Weight: Short Term  195 lb (88.5 kg)    Goal Weight: Long Term  190 lb (86.2 kg)    Expected Outcomes  Short Term: Continue to assess and modify interventions until short term weight is achieved;Long Term: Adherence to nutrition and physical activity/exercise program aimed toward attainment of established weight goal;Weight Loss: Understanding of general recommendations for a balanced deficit meal plan, which promotes 1-2 lb weight loss per week and includes a negative energy balance of [phone] kcal/d;Understanding recommendations for meals to include 15-35% energy as protein, 25-35% energy from fat, 35-60% energy from carbohydrates, less than 218m of dietary cholesterol, 20-35 gm of total fiber daily;Understanding of distribution of calorie intake throughout the day with the consumption of 4-5 meals/snacks       Education:Diabetes - Individual verbal and written instruction to review signs/symptoms of diabetes, desired ranges of glucose level fasting, after meals and with exercise. Acknowledge that pre and post exercise glucose checks will be done for 3 sessions at entry of program.   Education: Know Your Numbers and Risk Factors: -Group verbal and written instruction about important numbers in your health.  Discussion of what are risk factors and how they play a role in the disease process.  Review of Cholesterol, Blood Pressure, Diabetes, and BMI and the role they play in your overall health.   Core Components/Risk Factors/Patient Goals Review:  Goals and Risk Factor Review    Row Name 04/13/19 1534 05/14/19 1358 06/15/19 1545 07/13/19  1554 08/03/19 1608     Core Components/Risk Factors/Patient Goals Review   Personal Goals Review  Weight Management/Obesity;Heart Failure;Hypertension  Weight Management/Obesity;Heart Failure;Hypertension  Weight Management/Obesity;Heart Failure;Hypertension  Weight Management/Obesity;Other;Heart Failure  Weight Management/Obesity;Heart Failure   Review  When Matt first started, he lost a lot of weight very quickly.  It has now since stabilized and he is doing well now.  His pressures continue to do well.  He has not had symptoms of heart failure. He injured his wrist and it is swollen some. He is using ice and heat to treat  MCatalina Antiguais doing well with his weight staying steady.  He has not had any heart failure symptoms. He is doing well on his medications. His pressures have continued to do well.  MCatalina Antiguais doing well in rehab.  His weight is staying stedy. He has not had any edema or SOB above his normal.  Meds are going well.  Pressures have been good here in class as he does not have a cuff to check it at home.  Patient states that he would like to lose more weight. He has lost about 8 pounds since the start of the program. He also has had no signs of hypertension in class. The last time he talked to his cardiologist he states that there is nothing new going on with his heart and everything looks good. MCatalina Antiguais watching is fluids and wants to lose more weight in the next couple of weeks,  MCatalina Antiguahas been doing well.  His weight is staying steady around 190 lb.  He has not had any heart failure symptoms.  Blood pressures have been good.  Overall he is good.   Expected Outcomes  Short: Continue to improve strength and maintain weight.  Long: Continue  to manage heart failure.  Short: Continue to maintain weight.  Long: Continue to manage heart failure.  Short: Continue to maintain weight and start checking BP.  Long: Continue to manage heart failure.  Short: lose 5 pounds in the next couple weeks. Long: reach an  obtainable weight goal.  Short: Continue to work on weight loss Long: continue to improve strength for independence.      Core Components/Risk Factors/Patient Goals at Discharge (Final Review):  Goals and Risk Factor Review - 08/03/19 1608      Core Components/Risk Factors/Patient Goals Review   Personal Goals Review  Weight Management/Obesity;Heart Failure    Review  [name] has been doing well.  His weight is staying steady around 190 lb.  He has not had any heart failure symptoms.  Blood pressures have been good.  Overall he is good.    Expected Outcomes  Short: Continue to work on weight loss Long: continue to improve strength for independence.       ITP Comments: ITP Comments    Row Name 02/25/19 1531 03/16/19 1536 03/16/19 1551 03/25/19 1059 04/02/19 1132   ITP Comments  Virtual Orientation Completed.  Documentation for diagnosis can be found in [place] encounter 02/11/19.  EP eval scheduled for 11/17 at 330pm  First full day of exercise!  Patient was oriented to gym and equipment including functions, settings, policies, and procedures.  Patient's individual exercise prescription and treatment plan were reviewed.  All starting workloads were established based on the results of the 6 minute walk test done at initial orientation visit.  The plan for exercise progression was also introduced and progression will be customized based on patient's performance and goals.  Saw podiatrist, diagnosed with Plantar Fasciitis. Received cortisone shots and pain has been relieved.  30 day review competed . ITP sent to Dr Emily Filbert for review, changes as needed and ITP approval signature.  Completed Initial RD Eval   Row Name 04/22/19 4975 05/20/19 1507 06/17/19 0705 07/15/19 1040 07/20/19 1446   ITP Comments  30 day review competed . ITP sent to Dr Emily Filbert for review, changes as needed and ITP approval signature  30 day review completed. ITP sent to Dr. Emily Filbert, Medical Director of Cardiac and  Pulmonary Rehab. Continue with ITP unless changes are made by physician.  Department operating under reduced schedule until further notice by request from hospital leadership.  30 day chart review completed. ITP sent to Dr Zachery Dakins Medical Director, for review,changes as needed and signature.  30 day chart review completed. ITP sent to Dr Zachery Dakins Medical Director, for review,changes as needed and signature.  Matt called to let us know that he will be out again today.  His knee is still swollen and hurting, but improved since last week.  He is still not sure how exactly it ended up swollen.  He also mentioned that his dog passed last week, so he has been having a rough time dealing with that as well.   [place] Name 08/06/19 1242 08/12/19 0633         ITP Comments  Matt's knee is swollen again and he will not be here.  Encouraged him to call doctor.  30 Day review completed. Medical Director review done, changes made as directed,and approval shown by signature of Market researcher.         Comments:

## 2019-08-13 ENCOUNTER — Encounter: Payer: Medicare Other | Admitting: *Deleted

## 2019-08-13 ENCOUNTER — Other Ambulatory Visit: Payer: Self-pay

## 2019-08-13 DIAGNOSIS — I5022 Chronic systolic (congestive) heart failure: Secondary | ICD-10-CM

## 2019-08-13 DIAGNOSIS — I110 Hypertensive heart disease with heart failure: Secondary | ICD-10-CM | POA: Diagnosis not present

## 2019-08-13 DIAGNOSIS — I420 Dilated cardiomyopathy: Secondary | ICD-10-CM

## 2019-08-13 NOTE — Progress Notes (Signed)
Daily Session Note  Patient Details  Name: Gary Davila MRN: [id number] Date of Birth: [birth year] Referring Provider:     Cardiac Rehab from 03/10/2019 in [facility] Cardiac and Pulmonary Rehab  Referring Provider  [place]      Encounter Date: 08/13/2019  Check In: Session Check In - 08/13/19 1536      Check-In   Supervising physician immediately available to respond to emergencies  See telemetry face sheet for immediately available ER MD    Location  [facility]-Cardiac & Pulmonary Rehab    Staff Present  Renita Papa, RN BSN;Joseph Hood RCP,RRT,BSRT;Amanda Oletta Darter, [place], ACSM CEP, Exercise Physiologist    Virtual Visit  No    Medication changes reported      No    Fall or balance concerns reported     No    Warm-up and Cool-down  Performed on first and last piece of equipment    Resistance Training Performed  Yes    VAD Patient?  No    PAD/SET Patient?  No      Pain Assessment   Currently in Pain?  No/denies          Social History   Tobacco Use  Smoking Status Never Smoker  Smokeless Tobacco Never Used    Goals Met:  Independence with exercise equipment Exercise tolerated well No report of cardiac concerns or symptoms Strength training completed today  Goals Unmet:  Not Applicable  Comments: Pt able to follow exercise prescription today without complaint.  Will continue to monitor for progression.    Dr. Emily Filbert is Medical Director for [place] and [facility].

## 2019-08-17 ENCOUNTER — Encounter: Payer: Medicare Other | Admitting: *Deleted

## 2019-08-17 ENCOUNTER — Other Ambulatory Visit: Payer: Self-pay

## 2019-08-17 DIAGNOSIS — I5022 Chronic systolic (congestive) heart failure: Secondary | ICD-10-CM

## 2019-08-17 DIAGNOSIS — I420 Dilated cardiomyopathy: Secondary | ICD-10-CM

## 2019-08-17 DIAGNOSIS — I110 Hypertensive heart disease with heart failure: Secondary | ICD-10-CM | POA: Diagnosis not present

## 2019-08-17 NOTE — Progress Notes (Signed)
Daily Session Note  Patient Details  Name: Gary Davila MRN: [id number] Date of Birth: Jun 02, 1990 Referring Provider:     Cardiac Rehab from 03/10/2019 in [facility] Cardiac and Pulmonary Rehab  Referring Provider  Devore      Encounter Date: 08/17/2019  Check In: Session Check In - 08/17/19 1533      Check-In   Supervising physician immediately available to respond to emergencies  See telemetry face sheet for immediately available ER MD    Location  [facility]-Cardiac & Pulmonary Rehab    Staff Present  Renita Papa, RN BSN;Joseph [address] [place], [place], ACSM CEP, Exercise Physiologist    Virtual Visit  No    Medication changes reported      No    Fall or balance concerns reported     No    Warm-up and Cool-down  Performed on first and last piece of equipment    Resistance Training Performed  Yes    VAD Patient?  No    PAD/SET Patient?  No      Pain Assessment   Currently in Pain?  No/denies          Social History   Tobacco Use  Smoking Status Never Smoker  Smokeless Tobacco Never Used    Goals Met:  Independence with exercise equipment Exercise tolerated well No report of cardiac concerns or symptoms Strength training completed today  Goals Unmet:  Not Applicable  Comments: Pt able to follow exercise prescription today without complaint.  Will continue to monitor for progression.    Dr. Emily Filbert is Medical Director for [facility] and [facility].

## 2019-08-19 ENCOUNTER — Other Ambulatory Visit: Payer: Self-pay

## 2019-08-19 ENCOUNTER — Encounter: Payer: Medicare Other | Admitting: *Deleted

## 2019-08-19 DIAGNOSIS — I420 Dilated cardiomyopathy: Secondary | ICD-10-CM

## 2019-08-19 DIAGNOSIS — I5022 Chronic systolic (congestive) heart failure: Secondary | ICD-10-CM

## 2019-08-19 DIAGNOSIS — I110 Hypertensive heart disease with heart failure: Secondary | ICD-10-CM | POA: Diagnosis not present

## 2019-08-19 NOTE — Progress Notes (Signed)
Daily Session Note  Patient Details  Name: Gary Davila MRN: [id number] Date of Birth: 06/10/1990 Referring Provider:     Cardiac Rehab from 03/10/2019 in [facility] Cardiac and Pulmonary Rehab  Referring Provider  Devore      Encounter Date: 08/19/2019  Check In: Session Check In - 08/19/19 1540      Check-In   Supervising physician immediately available to respond to emergencies  See telemetry face sheet for immediately available ER MD    Location  [facility]-Cardiac & Pulmonary Rehab    Staff Present  Meredith Craven, RN BSN;Melissa Caiola RDN, LDN;Amanda Sommer, BA, ACSM CEP, Exercise Physiologist    Virtual Visit  No    Medication changes reported      No    Fall or balance concerns reported     No    Warm-up and Cool-down  Performed on first and last piece of equipment    Resistance Training Performed  Yes    VAD Patient?  No    PAD/SET Patient?  No      Pain Assessment   Currently in Pain?  No/denies          Social History   Tobacco Use  Smoking Status Never Smoker  Smokeless Tobacco Never Used    Goals Met:  Independence with exercise equipment Exercise tolerated well No report of cardiac concerns or symptoms Strength training completed today  Goals Unmet:  Not Applicable  Comments: Pt able to follow exercise prescription today without complaint.  Will continue to monitor for progression.'   Dr. Mark Miller is Medical Director for [facility] and [facility]. 

## 2019-08-20 ENCOUNTER — Other Ambulatory Visit: Payer: Self-pay

## 2019-08-20 ENCOUNTER — Encounter: Payer: Medicare Other | Admitting: *Deleted

## 2019-08-20 DIAGNOSIS — I110 Hypertensive heart disease with heart failure: Secondary | ICD-10-CM | POA: Diagnosis not present

## 2019-08-20 DIAGNOSIS — I5022 Chronic systolic (congestive) heart failure: Secondary | ICD-10-CM

## 2019-08-20 DIAGNOSIS — I420 Dilated cardiomyopathy: Secondary | ICD-10-CM

## 2019-08-20 NOTE — Progress Notes (Signed)
Daily Session Note  Patient Details  Name: Gary Davila MRN: [id number] Date of Birth: 18-Nov-1990 Referring Provider:     Cardiac Rehab from 03/10/2019 in [facility] Cardiac and Pulmonary Rehab  Referring Provider  Devore      Encounter Date: 08/20/2019  Check In: Session Check In - 08/20/19 1537      Check-In   Supervising physician immediately available to respond to emergencies  See telemetry face sheet for immediately available ER MD    Location  [facility]-Cardiac & Pulmonary Rehab    Staff Present  Renita Papa, RN BSN;Joseph [address] [place], [place], [place], CCRP, CCET    Virtual Visit  No    Medication changes reported      No    Fall or balance concerns reported     No    Warm-up and Cool-down  Performed on first and last piece of equipment    Resistance Training Performed  Yes    VAD Patient?  No    PAD/SET Patient?  No      Pain Assessment   Currently in Pain?  No/denies          Social History   Tobacco Use  Smoking Status Never Smoker  Smokeless Tobacco Never Used    Goals Met:  Independence with exercise equipment Exercise tolerated well No report of cardiac concerns or symptoms Strength training completed today  Goals Unmet:  Not Applicable  Comments: Pt able to follow exercise prescription today without complaint.  Will continue to monitor for progression.    Dr. Emily Filbert is Medical Director for [place] and [facility].

## 2019-08-27 ENCOUNTER — Encounter: Payer: Medicare Other | Attending: Cardiology | Admitting: *Deleted

## 2019-08-27 ENCOUNTER — Other Ambulatory Visit: Payer: Self-pay

## 2019-08-27 DIAGNOSIS — I110 Hypertensive heart disease with heart failure: Secondary | ICD-10-CM | POA: Insufficient documentation

## 2019-08-27 DIAGNOSIS — I4891 Unspecified atrial fibrillation: Secondary | ICD-10-CM | POA: Insufficient documentation

## 2019-08-27 DIAGNOSIS — Z79899 Other long term (current) drug therapy: Secondary | ICD-10-CM | POA: Diagnosis not present

## 2019-08-27 DIAGNOSIS — Z7901 Long term (current) use of anticoagulants: Secondary | ICD-10-CM | POA: Diagnosis not present

## 2019-08-27 DIAGNOSIS — I472 Ventricular tachycardia: Secondary | ICD-10-CM | POA: Diagnosis not present

## 2019-08-27 DIAGNOSIS — I420 Dilated cardiomyopathy: Secondary | ICD-10-CM | POA: Diagnosis not present

## 2019-08-27 DIAGNOSIS — I5022 Chronic systolic (congestive) heart failure: Secondary | ICD-10-CM

## 2019-08-27 DIAGNOSIS — Z95810 Presence of automatic (implantable) cardiac defibrillator: Secondary | ICD-10-CM | POA: Diagnosis not present

## 2019-08-27 DIAGNOSIS — E669 Obesity, unspecified: Secondary | ICD-10-CM | POA: Insufficient documentation

## 2019-08-27 NOTE — Progress Notes (Signed)
Discharge Progress Report  Patient Details  Name: Gary Davila MRN: [id number] Date of Birth: 1990-11-25 Referring Provider:     Cardiac Rehab from 03/10/2019 in [facility] Cardiac and Pulmonary Rehab  Referring Provider  Devore       Number of Visits: 3  Reason for Discharge:  Patient reached a stable level of exercise. Patient independent in their exercise. Patient has met program and personal goals.  Smoking History:  Social History   Tobacco Use  Smoking Status Never Smoker  Smokeless Tobacco Never Used    Diagnosis:  Heart failure, chronic systolic (HCC)  Dilated cardiomyopathy ([place])  ADL UCSD:   Initial Exercise Prescription: Initial Exercise Prescription - 03/10/19 1700      Date of Initial Exercise RX and Referring Provider   Date  03/10/19    Referring Provider  Devore      Treadmill   MPH  0.5    Grade  0    Minutes  15      Recumbant Bike   Level  1    RPM  60    Minutes  15    METs  3      NuStep   Level  2    SPM  80    Minutes  15    METs  3      Arm Ergometer   Level  1    RPM  25    Minutes  15    METs  3      REL-XR   Level  2    Speed  50    Minutes  15    METs  3      T5 Nustep   Level  1    SPM  80    Minutes  15    METs  3      Biostep-RELP   Level  2    SPM  50    Minutes  15    METs  3      Prescription Details   Frequency (times per week)  3    Duration  Progress to 30 minutes of continuous aerobic without signs/symptoms of physical distress      Intensity   THRR 40-80% of Max Heartrate  106-162    Ratings of Perceived Exertion  11-13    Perceived Dyspnea  0-4      Resistance Training   Training Prescription  Yes    Weight  3 lb    Reps  10-15       Discharge Exercise Prescription (Final Exercise Prescription Changes): Exercise Prescription Changes - 08/18/19 1500      Response to Exercise   Blood Pressure (Admit)  90/58    Blood Pressure (Exercise)  120/60    Blood Pressure (Exit)  100/60     Heart Rate (Admit)  77 bpm    Heart Rate (Exercise)  87 bpm    Heart Rate (Exit)  73 bpm    Rating of Perceived Exertion (Exercise)  12    Symptoms  none    Duration  Continue with 30 min of aerobic exercise without signs/symptoms of physical distress.    Intensity  THRR unchanged      Progression   Progression  Continue to progress workloads to maintain intensity without signs/symptoms of physical distress.    Average METs  2.1      Resistance Training   Training Prescription  Yes    Weight  3  lb    Reps  10-15      Interval Training   Interval Training  No      Treadmill   MPH  1.5    Grade  0    Minutes  15    METs  2.15      REL-XR   Level  1    Speed  50    Minutes  15       Functional Capacity: [place] Name 03/10/19 1653 08/19/19 1645       6 Minute Walk   Phase  --  Discharge    Distance  430 feet  705 feet    Distance % Change  --  64 %    Distance Feet Change  --  275 ft    Walk Time  6 minutes  6 minutes    # of Rest Breaks  0  0    MPH  0.8  1.33    METS  3.04  3.75    RPE  11  12    Perceived Dyspnea   0  0    VO2 Peak  10.65  13.14    Symptoms  Yes (comment)  No    Comments  foot pain - plantar fascitis 3/10  --    Resting HR  50 bpm  70 bpm    Resting BP  94/52  89/62    Resting Oxygen Saturation   99 %  --    Exercise Oxygen Saturation  during 6 min walk  97 %  --    Max Ex. HR  84 bpm  107 bpm    Max Ex. BP  94/54  94/66       Psychological, QOL, Others - Outcomes: PHQ 2/9: Depression screen [facility] 2/9 04/29/2019 03/10/2019 03/04/2019  Decreased Interest 0 0 0  Down, Depressed, Hopeless 0 0 0  PHQ - 2 Score 0 0 0  Altered sleeping - 0 -  Tired, decreased energy - 1 -  Change in appetite - 0 -  Feeling bad or failure about yourself  - 0 -  Trouble concentrating - 0 -  Moving slowly or fidgety/restless - 3 -  Suicidal thoughts - 0 -  PHQ-9 Score - 4 -  Difficult doing work/chores - Somewhat difficult -    Quality of  Life: Quality of Life - 03/10/19 1706      Quality of Life   Select  Quality of Life      Quality of Life Scores   Health/Function Pre  20.43 %    Socioeconomic Pre  23.29 %    Psych/Spiritual Pre  21.83 %    Family Pre  26.67 %    GLOBAL Pre  21.95 %       Personal Goals: Goals established at orientation with interventions provided to work toward goal. Personal Goals and Risk Factors at Admission - 03/10/19 1706      Core Components/Risk Factors/Patient Goals on Admission    Weight Management  Yes;Obesity;Weight Loss    Intervention  Weight Management: Develop a combined nutrition and exercise program designed to reach desired caloric intake, while maintaining appropriate intake of nutrient and fiber, sodium and fats, and appropriate energy expenditure required for the weight goal.;Weight Management: Provide education and appropriate resources to help participant work on and attain dietary goals.;Weight Management/Obesity: Establish reasonable short term and long term weight goals.;Obesity: Provide education and appropriate resources to help participant  work on and attain dietary goals.    Admit Weight  199 lb 8 oz (90.5 kg)    Goal Weight: Short Term  195 lb (88.5 kg)    Goal Weight: Long Term  190 lb (86.2 kg)    Expected Outcomes  Short Term: Continue to assess and modify interventions until short term weight is achieved;Long Term: Adherence to nutrition and physical activity/exercise program aimed toward attainment of established weight goal;Weight Loss: Understanding of general recommendations for a balanced deficit meal plan, which promotes 1-2 lb weight loss per week and includes a negative energy balance of [phone] kcal/d;Understanding recommendations for meals to include 15-35% energy as protein, 25-35% energy from fat, 35-60% energy from carbohydrates, less than '200mg'$  of dietary cholesterol, 20-35 gm of total fiber daily;Understanding of distribution of calorie intake throughout  the day with the consumption of 4-5 meals/snacks        Personal Goals Discharge: Goals and Risk Factor Review    Row Name 04/13/19 1534 05/14/19 1358 06/15/19 1545 07/13/19 1554 08/03/19 1608     Core Components/Risk Factors/Patient Goals Review   Personal Goals Review  Weight Management/Obesity;Heart Failure;Hypertension  Weight Management/Obesity;Heart Failure;Hypertension  Weight Management/Obesity;Heart Failure;Hypertension  Weight Management/Obesity;Other;Heart Failure  Weight Management/Obesity;Heart Failure   Review  When Matt first started, he lost a lot of weight very quickly.  It has now since stabilized and he is doing well now.  His pressures continue to do well.  He has not had symptoms of heart failure. He injured his wrist and it is swollen some. He is using ice and heat to treat  [name] is doing well with his weight staying steady.  He has not had any heart failure symptoms. He is doing well on his medications. His pressures have continued to do well.  [name] is doing well in rehab.  His weight is staying stedy. He has not had any edema or SOB above his normal.  Meds are going well.  Pressures have been good here in class as he does not have a cuff to check it at home.  Patient states that he would like to lose more weight. He has lost about 8 pounds since the start of the program. He also has had no signs of hypertension in class. The last time he talked to his cardiologist he states that there is nothing new going on with his heart and everything looks good. [name] is watching is fluids and wants to lose more weight in the next couple of weeks,  [name] has been doing well.  His weight is staying steady around 190 lb.  He has not had any heart failure symptoms.  Blood pressures have been good.  Overall he is good.   Expected Outcomes  Short: Continue to improve strength and maintain weight.  Long: Continue to manage heart failure.  Short: Continue to maintain weight.  Long: Continue to manage  heart failure.  Short: Continue to maintain weight and start checking BP.  Long: Continue to manage heart failure.  Short: lose 5 pounds in the next couple weeks. Long: reach an obtainable weight goal.  Short: Continue to work on weight loss Long: continue to improve strength for independence.      Exercise Goals and Review: Exercise Goals    Row Name 03/10/19 1704             Exercise Goals   Increase Physical Activity  Yes       Intervention  Provide advice, education, support and counseling  about physical activity/exercise needs.;Develop an individualized exercise prescription for aerobic and resistive training based on initial evaluation findings, risk stratification, comorbidities and participant's personal goals.       Expected Outcomes  Short Term: Attend rehab on a regular basis to increase amount of physical activity.;Long Term: Add in home exercise to make exercise part of routine and to increase amount of physical activity.;Long Term: Exercising regularly at least 3-5 days a week.       Increase Strength and Stamina  Yes       Intervention  Provide advice, education, support and counseling about physical activity/exercise needs.;Develop an individualized exercise prescription for aerobic and resistive training based on initial evaluation findings, risk stratification, comorbidities and participant's personal goals.       Expected Outcomes  Short Term: Increase workloads from initial exercise prescription for resistance, speed, and METs.;Short Term: Perform resistance training exercises routinely during rehab and add in resistance training at home;Long Term: Improve cardiorespiratory fitness, muscular endurance and strength as measured by increased METs and functional capacity (6MWT)       Able to understand and use rate of perceived exertion (RPE) scale  Yes       Intervention  Provide education and explanation on how to use RPE scale       Expected Outcomes  Short Term: Able to use  RPE daily in rehab to express subjective intensity level;Long Term:  Able to use RPE to guide intensity level when exercising independently       Knowledge and understanding of Target Heart Rate Range (THRR)  Yes       Intervention  Provide education and explanation of THRR including how the numbers were predicted and where they are located for reference       Expected Outcomes  Short Term: Able to state/look up THRR;Short Term: Able to use daily as guideline for intensity in rehab;Long Term: Able to use THRR to govern intensity when exercising independently       Understanding of Exercise Prescription  Yes       Intervention  Provide education, explanation, and written materials on patient's individual exercise prescription       Expected Outcomes  Short Term: Able to explain program exercise prescription;Long Term: Able to explain home exercise prescription to exercise independently          Exercise Goals Re-Evaluation: Exercise Goals Re-Evaluation    Row Name 03/16/19 1537 04/02/19 1456 04/13/19 1527 04/30/19 1528 05/14/19 1353     Exercise Goal Re-Evaluation   Exercise Goals Review  Increase Physical Activity;Knowledge and understanding of Target Heart Rate Range (THRR);Able to check pulse independently;Understanding of Exercise Prescription;Able to understand and use rate of perceived exertion (RPE) scale  Increase Physical Activity;Increase Strength and Stamina;Able to understand and use rate of perceived exertion (RPE) scale;Able to understand and use Dyspnea scale;Knowledge and understanding of Target Heart Rate Range (THRR);Understanding of Exercise Prescription  Increase Physical Activity;Increase Strength and Stamina;Able to understand and use rate of perceived exertion (RPE) scale;Able to understand and use Dyspnea scale;Knowledge and understanding of Target Heart Rate Range (THRR);Understanding of Exercise Prescription;Able to check pulse independently  Increase Physical  Activity;Increase Strength and Stamina;Able to understand and use rate of perceived exertion (RPE) scale;Able to understand and use Dyspnea scale;Knowledge and understanding of Target Heart Rate Range (THRR);Understanding of Exercise Prescription  Increase Physical Activity;Increase Strength and Stamina;Understanding of Exercise Prescription   Comments  Reviewed RPE scale, THR and program prescription with pt today.  Pt voiced understanding  and was given a copy of goals to take home.  [name] is doing well in rehab.  he has had some ankle pain but continues to attend and try all machines.  Staff will monitor progess.  [name] is doing well in rehab.  He has started to be able to move more at home.  He has no real routine going.  Reviewed home exercise with pt today.  Pt plans to walk and use staff videos at home for exercise.  Reviewed THR, pulse, RPE, sign and symptoms, and when to call 911 or MD.  Also discussed weather considerations and indoor options.  Pt voiced understanding.  Matt has increased to 4 lb weight with one hand - other wrist is sprained and he is using 2 lb wieght until it feels better.  He did get new shoes and says he hasnt ad any new foot pain.  [name] is doing well.  He does his daily stretches and walks some with his mom.  He has been able to be more active around the house.  We talked about walking every day at home.  We talked about a goal of walking down here.  Overall, stamina is improving.   Expected Outcomes  Short: Use RPE daily to regulate intensity. Long: Follow program prescription in THR.  Short - conitnue to attend consistently Long - improve stamina  Short: Start to add in exercise at home.   Long: Continue to improve stamina.  Short - exercise consistently on his own Long : improve overall stamina  Short: Walk more at home and starting walking to class.  Long:  Continue to improve stamina.   [place] Name 05/29/19 6767 06/11/19 0920 06/15/19 1541 06/25/19 1126 07/08/19 1406     Exercise  Goal Re-Evaluation   Exercise Goals Review  Increase Physical Activity;Able to understand and use rate of perceived exertion (RPE) scale;Knowledge and understanding of Target Heart Rate Range (THRR);Understanding of Exercise Prescription;Increase Strength and Stamina;Able to understand and use Dyspnea scale;Able to check pulse independently  Increase Physical Activity;Increase Strength and Stamina;Understanding of Exercise Prescription  Increase Physical Activity;Increase Strength and Stamina;Understanding of Exercise Prescription  Increase Physical Activity;Increase Strength and Stamina;Able to understand and use rate of perceived exertion (RPE) scale;Able to understand and use Dyspnea scale;Knowledge and understanding of Target Heart Rate Range (THRR);Able to check pulse independently;Understanding of Exercise Prescription  Increase Physical Activity;Increase Strength and Stamina;Understanding of Exercise Prescription   Comments  [name] states he stamina is getting better.  He can walk up stairs at home unassisted and can stand the whole time in the shower.  He walks outside at home.  [name] has been doing better in rehab.  He has actually been able to tackle the elliptical some.  He was not able to increase this week as his knee swelled up and he was not able to walk as well.  We will continue to monitor his progress.  [name] is doing well in rehab.  He is getting in some exercise/activity by going up and down stairs and feeding dog.  His goal was to walk to and from rehab and he is now doing it!  [name] has stated he feels he is getting stronger.  He does continue to walk down, exercise and walk back up.  [name] continues to improve in rehab.  He is up to 2.1 METs on the XR.  We will continue to monitor his progress.   Expected Outcomes  Short - continue to be active at home Long :  walk down to rehab gym before and after exercise  Short: Increase speed on treadmill and seated equipment.  Long: Continue to improve  stamina.  Short: More activity at home.  Long: Continue to improve stamina.  Short:  more active at home Long: improve overall stamina  Short: Continue to try to increase speed on treadmill  Long: Continue to move more at home.   [place] Name 07/13/19 1548 07/22/19 1536 08/03/19 1604 08/06/19 1347 08/18/19 1551     Exercise Goal Re-Evaluation   Exercise Goals Review  Increase Physical Activity;Increase Strength and Stamina  --  Increase Physical Activity;Increase Strength and Stamina;Understanding of Exercise Prescription  Increase Physical Activity;Increase Strength and Stamina;Understanding of Exercise Prescription  Increase Physical Activity;Increase Strength and Stamina;Able to understand and use rate of perceived exertion (RPE) scale;Able to understand and use Dyspnea scale;Knowledge and understanding of Target Heart Rate Range (THRR);Able to check pulse independently;Understanding of Exercise Prescription   Comments  [name] has got resistance bands that he obtained from rehab and is able to do some stretches at home. When he is done with the program matt is thinking about going to [employer].  Out since last review with swollen knee  [name] has returned to class and his knee is feeling better.  He has been using his exercise band at home and walking some at home.  His strength is improving.  [name] continues ot improve and admitted on phone today that he can tell that rehab is helping him.  He is now able to walk down here and more on the treadmill.  His feet and knee are his biggest limitation. We will continue to monitor his progress.  [name] will complete HT program in 6 sessions.  He has made a lot of progress in the program.   Expected Outcomes  Short: due more home exercises. Long: Work on exercise at [place]: Continue to add in more exercise at home.  Long; Continue to improve stamina.  Short: Continue to add more time on treadmill Long: Continue to improve stamina.  Short : continue to  walk as much as possible Long:  complete HT program      Nutrition & Weight - Outcomes:    Nutrition: Nutrition Therapy & Goals - 04/02/19 1102      Nutrition Therapy   Diet  Low Na, HH diet    Drug/Food Interactions  Coumadin/Vit K    Protein (specify units)  75g    Fiber  30 grams    Whole Grain Foods  3 servings    Saturated Fats  12 max. grams    Fruits and Vegetables  5 servings/day    Sodium  1.5 grams      Personal Nutrition Goals   Nutrition Goal  ST: switch out one glass of sugar-sweetened beverage with water LT: increase strength and muscle mass    Comments  B: bowl of cereal cheerios, cinnamon toast crunch, rice krispies (whole milk). L: chicken with vegetables sometimes D: varies (meat, starch, vegetable) - red meat or poultry. Drinks: sweet tea or soda. 2-3 glasses/can. Discussed HH eating and MNT for muscle building.      Intervention Plan   Intervention  Prescribe, educate and counsel regarding individualized specific dietary modifications aiming towards targeted core components such as weight, hypertension, lipid management, diabetes, heart failure and other comorbidities.;Nutrition handout(s) given to patient.    Expected Outcomes  Short Term Goal: Understand basic principles of dietary content, such as calories, fat,  sodium, cholesterol and nutrients.;Short Term Goal: A plan has been developed with personal nutrition goals set during dietitian appointment.;Long Term Goal: Adherence to prescribed nutrition plan.       Nutrition Discharge:   Education Questionnaire Score:   Goals reviewed with patient; copy given to patient.

## 2019-08-27 NOTE — Patient Instructions (Signed)
Discharge Patient Instructions  Patient Details  Name: Gary Davila MRN: [id number] Date of Birth: October 05, 1990 Referring Provider:  Tylene Fantasia, MD   Number of Visits: 36  Reason for Discharge:  Patient reached a stable level of exercise. Patient independent in their exercise. Patient has met program and personal goals.  Smoking History:  Social History   Tobacco Use  Smoking Status Never Smoker  Smokeless Tobacco Never Used    Diagnosis:  Heart failure, chronic systolic (HCC)  Dilated cardiomyopathy ([place])  Initial Exercise Prescription: Initial Exercise Prescription - 03/10/19 1700      Date of Initial Exercise RX and Referring Provider   Date  03/10/19    Referring Provider  Devore      Treadmill   MPH  0.5    Grade  0    Minutes  15      Recumbant Bike   Level  1    RPM  60    Minutes  15    METs  3      NuStep   Level  2    SPM  80    Minutes  15    METs  3      Arm Ergometer   Level  1    RPM  25    Minutes  15    METs  3      REL-XR   Level  2    Speed  50    Minutes  15    METs  3      T5 Nustep   Level  1    SPM  80    Minutes  15    METs  3      Biostep-RELP   Level  2    SPM  50    Minutes  15    METs  3      Prescription Details   Frequency (times per week)  3    Duration  Progress to 30 minutes of continuous aerobic without signs/symptoms of physical distress      Intensity   THRR 40-80% of Max Heartrate  106-162    Ratings of Perceived Exertion  11-13    Perceived Dyspnea  0-4      Resistance Training   Training Prescription  Yes    Weight  3 lb    Reps  10-15       Discharge Exercise Prescription (Final Exercise Prescription Changes): Exercise Prescription Changes - 08/18/19 1500      Response to Exercise   Blood Pressure (Admit)  90/58    Blood Pressure (Exercise)  120/60    Blood Pressure (Exit)  100/60    Heart Rate (Admit)  77 bpm    Heart Rate (Exercise)  87 bpm    Heart Rate (Exit)  73 bpm     Rating of Perceived Exertion (Exercise)  12    Symptoms  none    Duration  Continue with 30 min of aerobic exercise without signs/symptoms of physical distress.    Intensity  THRR unchanged      Progression   Progression  Continue to progress workloads to maintain intensity without signs/symptoms of physical distress.    Average METs  2.1      Resistance Training   Training Prescription  Yes    Weight  3 lb    Reps  10-15      Interval Training   Interval Training  No  Treadmill   MPH  1.5    Grade  0    Minutes  15    METs  2.15      REL-XR   Level  1    Speed  50    Minutes  15       Functional Capacity: 6 Minute Walk    Row Name 03/10/19 1653 08/19/19 1645       6 Minute Walk   Phase  -  Discharge    Distance  430 feet  705 feet    Distance % Change  -  64 %    Distance Feet Change  -  275 ft    Walk Time  6 minutes  6 minutes    # of Rest Breaks  0  0    MPH  0.8  1.33    METS  3.04  3.75    RPE  11  12    Perceived Dyspnea   0  0    VO2 Peak  10.65  13.14    Symptoms  Yes (comment)  No    Comments  foot pain - plantar fascitis 3/10  -    Resting HR  50 bpm  70 bpm    Resting BP  94/52  89/62    Resting Oxygen Saturation   99 %  -    Exercise Oxygen Saturation  during 6 min walk  97 %  -    Max Ex. HR  84 bpm  107 bpm    Max Ex. BP  94/54  94/66       Quality of Life: Quality of Life - 03/10/19 1706      Quality of Life   Select  Quality of Life      Quality of Life Scores   Health/Function Pre  20.43 %    Socioeconomic Pre  23.29 %    Psych/Spiritual Pre  21.83 %    Family Pre  26.67 %    GLOBAL Pre  21.95 %       Personal Goals: Goals established at orientation with interventions provided to work toward goal. Personal Goals and Risk Factors at Admission - 03/10/19 1706      Core Components/Risk Factors/Patient Goals on Admission    Weight Management  Yes;Obesity;Weight Loss    Intervention  Weight Management: Develop a  combined nutrition and exercise program designed to reach desired caloric intake, while maintaining appropriate intake of nutrient and fiber, sodium and fats, and appropriate energy expenditure required for the weight goal.;Weight Management: Provide education and appropriate resources to help participant work on and attain dietary goals.;Weight Management/Obesity: Establish reasonable short term and long term weight goals.;Obesity: Provide education and appropriate resources to help participant work on and attain dietary goals.    Admit Weight  199 lb 8 oz (90.5 kg)    Goal Weight: Short Term  195 lb (88.5 kg)    Goal Weight: Long Term  190 lb (86.2 kg)    Expected Outcomes  Short Term: Continue to assess and modify interventions until short term weight is achieved;Long Term: Adherence to nutrition and physical activity/exercise program aimed toward attainment of established weight goal;Weight Loss: Understanding of general recommendations for a balanced deficit meal plan, which promotes 1-2 lb weight loss per week and includes a negative energy balance of [phone] kcal/d;Understanding recommendations for meals to include 15-35% energy as protein, 25-35% energy from fat, 35-60% energy from carbohydrates, less than 228m of dietary cholesterol,  20-35 gm of total fiber daily;Understanding of distribution of calorie intake throughout the day with the consumption of 4-5 meals/snacks        Personal Goals Discharge: Goals and Risk Factor Review - 08/03/19 1608      Core Components/Risk Factors/Patient Goals Review   Personal Goals Review  Weight Management/Obesity;Heart Failure    Review  [name] has been doing well.  His weight is staying steady around 190 lb.  He has not had any heart failure symptoms.  Blood pressures have been good.  Overall he is good.    Expected Outcomes  Short: Continue to work on weight loss Long: continue to improve strength for independence.       Exercise Goals and  Review: Exercise Goals    Row Name 03/10/19 1704             Exercise Goals   Increase Physical Activity  Yes       Intervention  Provide advice, education, support and counseling about physical activity/exercise needs.;Develop an individualized exercise prescription for aerobic and resistive training based on initial evaluation findings, risk stratification, comorbidities and participant's personal goals.       Expected Outcomes  Short Term: Attend rehab on a regular basis to increase amount of physical activity.;Long Term: Add in home exercise to make exercise part of routine and to increase amount of physical activity.;Long Term: Exercising regularly at least 3-5 days a week.       Increase Strength and Stamina  Yes       Intervention  Provide advice, education, support and counseling about physical activity/exercise needs.;Develop an individualized exercise prescription for aerobic and resistive training based on initial evaluation findings, risk stratification, comorbidities and participant's personal goals.       Expected Outcomes  Short Term: Increase workloads from initial exercise prescription for resistance, speed, and METs.;Short Term: Perform resistance training exercises routinely during rehab and add in resistance training at home;Long Term: Improve cardiorespiratory fitness, muscular endurance and strength as measured by increased METs and functional capacity (6MWT)       Able to understand and use rate of perceived exertion (RPE) scale  Yes       Intervention  Provide education and explanation on how to use RPE scale       Expected Outcomes  Short Term: Able to use RPE daily in rehab to express subjective intensity level;Long Term:  Able to use RPE to guide intensity level when exercising independently       Knowledge and understanding of Target Heart Rate Range (THRR)  Yes       Intervention  Provide education and explanation of THRR including how the numbers were predicted and  where they are located for reference       Expected Outcomes  Short Term: Able to state/look up THRR;Short Term: Able to use daily as guideline for intensity in rehab;Long Term: Able to use THRR to govern intensity when exercising independently       Understanding of Exercise Prescription  Yes       Intervention  Provide education, explanation, and written materials on patient's individual exercise prescription       Expected Outcomes  Short Term: Able to explain program exercise prescription;Long Term: Able to explain home exercise prescription to exercise independently          Exercise Goals Re-Evaluation: Exercise Goals Re-Evaluation    Row Name 03/16/19 1537 04/02/19 1456 04/13/19 1527 04/30/19 1528 05/14/19 1353     Exercise  Goal Re-Evaluation   Exercise Goals Review  Increase Physical Activity;Knowledge and understanding of Target Heart Rate Range (THRR);Able to check pulse independently;Understanding of Exercise Prescription;Able to understand and use rate of perceived exertion (RPE) scale  Increase Physical Activity;Increase Strength and Stamina;Able to understand and use rate of perceived exertion (RPE) scale;Able to understand and use Dyspnea scale;Knowledge and understanding of Target Heart Rate Range (THRR);Understanding of Exercise Prescription  Increase Physical Activity;Increase Strength and Stamina;Able to understand and use rate of perceived exertion (RPE) scale;Able to understand and use Dyspnea scale;Knowledge and understanding of Target Heart Rate Range (THRR);Understanding of Exercise Prescription;Able to check pulse independently  Increase Physical Activity;Increase Strength and Stamina;Able to understand and use rate of perceived exertion (RPE) scale;Able to understand and use Dyspnea scale;Knowledge and understanding of Target Heart Rate Range (THRR);Understanding of Exercise Prescription  Increase Physical Activity;Increase Strength and Stamina;Understanding of Exercise  Prescription   Comments  Reviewed RPE scale, THR and program prescription with pt today.  Pt voiced understanding and was given a copy of goals to take home.  [name] is doing well in rehab.  he has had some ankle pain but continues to attend and try all machines.  Staff will monitor progess.  [name] is doing well in rehab.  He has started to be able to move more at home.  He has no real routine going.  Reviewed home exercise with pt today.  Pt plans to walk and use staff videos at home for exercise.  Reviewed THR, pulse, RPE, sign and symptoms, and when to call 911 or MD.  Also discussed weather considerations and indoor options.  Pt voiced understanding.  Matt has increased to 4 lb weight with one hand - other wrist is sprained and he is using 2 lb wieght until it feels better.  He did get new shoes and says he hasnt ad any new foot pain.  [name] is doing well.  He does his daily stretches and walks some with his mom.  He has been able to be more active around the house.  We talked about walking every day at home.  We talked about a goal of walking down here.  Overall, stamina is improving.   Expected Outcomes  Short: Use RPE daily to regulate intensity. Long: Follow program prescription in THR.  Short - conitnue to attend consistently Long - improve stamina  Short: Start to add in exercise at home.   Long: Continue to improve stamina.  Short - exercise consistently on his own Long : improve overall stamina  Short: Walk more at home and starting walking to class.  Long:  Continue to improve stamina.   [place] Name 05/29/19 4008 06/11/19 0920 06/15/19 1541 06/25/19 1126 07/08/19 1406     Exercise Goal Re-Evaluation   Exercise Goals Review  Increase Physical Activity;Able to understand and use rate of perceived exertion (RPE) scale;Knowledge and understanding of Target Heart Rate Range (THRR);Understanding of Exercise Prescription;Increase Strength and Stamina;Able to understand and use Dyspnea scale;Able to check pulse  independently  Increase Physical Activity;Increase Strength and Stamina;Understanding of Exercise Prescription  Increase Physical Activity;Increase Strength and Stamina;Understanding of Exercise Prescription  Increase Physical Activity;Increase Strength and Stamina;Able to understand and use rate of perceived exertion (RPE) scale;Able to understand and use Dyspnea scale;Knowledge and understanding of Target Heart Rate Range (THRR);Able to check pulse independently;Understanding of Exercise Prescription  Increase Physical Activity;Increase Strength and Stamina;Understanding of Exercise Prescription   Comments  [name] states he stamina is getting better.  He can walk  up stairs at home unassisted and can stand the whole time in the shower.  He walks outside at home.  [name] has been doing better in rehab.  He has actually been able to tackle the elliptical some.  He was not able to increase this week as his knee swelled up and he was not able to walk as well.  We will continue to monitor his progress.  [name] is doing well in rehab.  He is getting in some exercise/activity by going up and down stairs and feeding dog.  His goal was to walk to and from rehab and he is now doing it!  [name] has stated he feels he is getting stronger.  He does continue to walk down, exercise and walk back up.  [name] continues to improve in rehab.  He is up to 2.1 METs on the XR.  We will continue to monitor his progress.   Expected Outcomes  Short - continue to be active at home Long : walk down to rehab gym before and after exercise  Short: Increase speed on treadmill and seated equipment.  Long: Continue to improve stamina.  Short: More activity at home.  Long: Continue to improve stamina.  Short:  more active at home Long: improve overall stamina  Short: Continue to try to increase speed on treadmill  Long: Continue to move more at home.   [place] Name 07/13/19 1548 07/22/19 1536 08/03/19 1604 08/06/19 1347 08/18/19 1551     Exercise Goal  Re-Evaluation   Exercise Goals Review  Increase Physical Activity;Increase Strength and Stamina  -  Increase Physical Activity;Increase Strength and Stamina;Understanding of Exercise Prescription  Increase Physical Activity;Increase Strength and Stamina;Understanding of Exercise Prescription  Increase Physical Activity;Increase Strength and Stamina;Able to understand and use rate of perceived exertion (RPE) scale;Able to understand and use Dyspnea scale;Knowledge and understanding of Target Heart Rate Range (THRR);Able to check pulse independently;Understanding of Exercise Prescription   Comments  [name] has got resistance bands that he obtained from rehab and is able to do some stretches at home. When he is done with the program matt is thinking about going to [employer].  Out since last review with swollen knee  [name] has returned to class and his knee is feeling better.  He has been using his exercise band at home and walking some at home.  His strength is improving.  [name] continues ot improve and admitted on phone today that he can tell that rehab is helping him.  He is now able to walk down here and more on the treadmill.  His feet and knee are his biggest limitation. We will continue to monitor his progress.  [name] will complete HT program in 6 sessions.  He has made a lot of progress in the program.   Expected Outcomes  Short: due more home exercises. Long: Work on exercise at [place]: Continue to add in more exercise at home.  Long; Continue to improve stamina.  Short: Continue to add more time on treadmill Long: Continue to improve stamina.  Short : continue to walk as much as possible Long:  complete HT program      Nutrition & Weight - Outcomes:    Nutrition: Nutrition Therapy & Goals - 04/02/19 1102      Nutrition Therapy   Diet  Low Na, HH diet    Drug/Food Interactions  Coumadin/Vit K    Protein (specify units)  75g    Fiber  30 grams  Whole Grain Foods  3 servings     Saturated Fats  12 max. grams    Fruits and Vegetables  5 servings/day    Sodium  1.5 grams      Personal Nutrition Goals   Nutrition Goal  ST: switch out one glass of sugar-sweetened beverage with water LT: increase strength and muscle mass    Comments  B: bowl of cereal cheerios, cinnamon toast crunch, rice krispies (whole milk). L: chicken with vegetables sometimes D: varies (meat, starch, vegetable) - red meat or poultry. Drinks: sweet tea or soda. 2-3 glasses/can. Discussed HH eating and MNT for muscle building.      Intervention Plan   Intervention  Prescribe, educate and counsel regarding individualized specific dietary modifications aiming towards targeted core components such as weight, hypertension, lipid management, diabetes, heart failure and other comorbidities.;Nutrition handout(s) given to patient.    Expected Outcomes  Short Term Goal: Understand basic principles of dietary content, such as calories, fat, sodium, cholesterol and nutrients.;Short Term Goal: A plan has been developed with personal nutrition goals set during dietitian appointment.;Long Term Goal: Adherence to prescribed nutrition plan.       Nutrition Discharge:   Education Questionnaire Score:   Goals reviewed with patient; copy given to patient.

## 2019-08-27 NOTE — Progress Notes (Signed)
Daily Session Note  Patient Details  Name: Gary Davila MRN: [id number] Date of Birth: Oct 18, 1990 Referring Provider:     Cardiac Rehab from 03/10/2019 in [facility]  Referring Provider  Devore      Encounter Date: 08/27/2019  Check In: Session Check In - 08/27/19 1530      Check-In   Supervising physician immediately available to respond to emergencies  See telemetry face sheet for immediately available ER MD    Location  [facility]-Cardiac & Pulmonary Rehab    Staff Present  Alberteen Sam, MA, RCEP, CCRP, CCET;Angles Trevizo Sherryll Burger, RN BSN;Joseph Hood RCP,RRT,BSRT    Virtual Visit  No    Medication changes reported      No    Fall or balance concerns reported     No    Warm-up and Cool-down  Performed on first and last piece of equipment    Resistance Training Performed  Yes    VAD Patient?  No    PAD/SET Patient?  No      Pain Assessment   Currently in Pain?  No/denies          Social History   Tobacco Use  Smoking Status Never Smoker  Smokeless Tobacco Never Used    Goals Met:  Independence with exercise equipment Exercise tolerated well No report of cardiac concerns or symptoms Strength training completed today  Goals Unmet:  Not Applicable  Comments:  Beniah graduated today from  rehab with 33 sessions completed.  Details of the patient's exercise prescription and what He needs to do in order to continue the prescription and progress were discussed with patient.  Patient was given a copy of prescription and goals.  Patient verbalized understanding.  Edouard plans to continue to exercise by walking at home.    Dr. Emily Filbert is Medical Director for [facility].

## 2019-08-27 NOTE — Progress Notes (Signed)
Cardiac Individual Treatment Plan  Patient Details  Name: Gary Davila MRN: [id number] Date of Birth: 1990/07/24 Referring Provider:     Cardiac Rehab from 03/10/2019 in [facility] Cardiac and Pulmonary Rehab  Referring Provider  [place]      Initial Encounter Date:    Cardiac Rehab from 03/10/2019 in [facility] Cardiac and Pulmonary Rehab  Date  03/10/19      Visit Diagnosis: Heart failure, chronic systolic ([place])  Dilated cardiomyopathy ([place])  Patient's Home Medications on Admission:  Current Outpatient Medications:  .  carvedilol (COREG) 25 MG tablet, TAKE (1) TABLET TWICE A DAY., Disp: , Rfl:  .  diclofenac Sodium (VOLTAREN) 1 % GEL, Apply 2 g topically 3 (three) times dailyas needed., Disp: 100 g, Rfl: 1 .  digoxin (LANOXIN) 0.125 MG tablet, TAKE 1 TABLET DAILY., Disp: , Rfl:  .  empagliflozin (JARDIANCE) 10 MG TABS tablet, Take by mouth., Disp: , Rfl:  .  eplerenone (INSPRA) 25 MG tablet, TAKE 1 TABLET DAILY., Disp: , Rfl:  .  furosemide (LASIX) 20 MG tablet, Take 40 mg by mouth 2 (two) times daily., Disp: , Rfl:  .  ibuprofen (ADVIL,MOTRIN) 200 MG tablet, Take by mouth., Disp: , Rfl:  .  warfarin (COUMADIN) 5 MG tablet, Take 7.650m (one and half pill of 529m on Tues / Thurs, then 50m16mvery other day., Disp: , Rfl:   Past Medical History: Past Medical History:  Diagnosis Date  . [place] ([place] . Chronic CHF (congestive heart failure) ([place] . Coarctation of aorta   . Dilated cardiomyopathy ([place] . Hypertension   . ICD (implantable cardioverter-defibrillator) in place   . ICD (implantable cardioverter-defibrillator) lead failure   . Infection of pacemaker pocket ([place] . Left ventricular diastolic dysfunction, NYHA class 2   . Obesity   . Ventricular tachycardia (HCC)     Tobacco Use: Social History   Tobacco Use  Smoking Status Never Smoker  Smokeless Tobacco Never Used    Labs: Recent Review Flowsheet Data    There is no flowsheet data to display.        Exercise Target Goals: Exercise Program Goal: Individual exercise prescription set using results from initial 6 min walk test and THRR while considering  patient's activity barriers and safety.   Exercise Prescription Goal: Initial exercise prescription builds to 30-45 minutes a day of aerobic activity, 2-3 days per week.  Home exercise guidelines will be given to patient during program as part of exercise prescription that the participant will acknowledge.   Education: Aerobic Exercise & Resistance Training: - Gives group verbal and written instruction on the various components of exercise. Focuses on aerobic and resistive training programs and the benefits of this training and how to safely progress through these programs..   Education: Exercise & Equipment Safety: - Individual verbal instruction and demonstration of equipment use and safety with use of the equipment.   [facility]  Date  03/10/19  Educator  AS  Instruction Review Code  1- Verbalizes Understanding      Education: Exercise Physiology & General Exercise Guidelines: - Group verbal and written instruction with models to review the exercise physiology of the cardiovascular system and associated critical values. Provides general exercise guidelines with specific guidelines to those with heart or lung disease.    Education: Flexibility, Balance, Mind/Body Relaxation: Provides group verbal/written instruction on the benefits of flexibility and balance training, including mind/body exercise  modes such as yoga, pilates and tai chi.  Demonstration and skill practice provided.   Cardiac Rehab from 03/26/2019 in [facility] Cardiac and Pulmonary Rehab  Date  03/26/19  Educator  AS  Instruction Review Code  1- Verbalizes Understanding      Activity Barriers & Risk Stratification:   6 Minute Walk: 6 Minute Walk    Row Name 03/10/19 1653 08/19/19 1645       6 Minute  Walk   Phase  --  Discharge    Distance  430 feet  705 feet    Distance % Change  --  64 %    Distance Feet Change  --  275 ft    Walk Time  6 minutes  6 minutes    # of Rest Breaks  0  0    MPH  0.8  1.33    METS  3.04  3.75    RPE  11  12    Perceived Dyspnea   0  0    VO2 Peak  10.65  13.14    Symptoms  Yes (comment)  No    Comments  foot pain - plantar fascitis 3/10  --    Resting HR  50 bpm  70 bpm    Resting BP  94/52  89/62    Resting Oxygen Saturation   99 %  --    Exercise Oxygen Saturation  during 6 min walk  97 %  --    Max Ex. HR  84 bpm  107 bpm    Max Ex. BP  94/54  94/66       Oxygen Initial Assessment:   Oxygen Re-Evaluation:   Oxygen Discharge (Final Oxygen Re-Evaluation):   Initial Exercise Prescription: Initial Exercise Prescription - 03/10/19 1700      Date of Initial Exercise RX and Referring Provider   Date  03/10/19    Referring Provider  Devore      Treadmill   MPH  0.5    Grade  0    Minutes  15      Recumbant Bike   Level  1    RPM  60    Minutes  15    METs  3      NuStep   Level  2    SPM  80    Minutes  15    METs  3      Arm Ergometer   Level  1    RPM  25    Minutes  15    METs  3      REL-XR   Level  2    Speed  50    Minutes  15    METs  3      T5 Nustep   Level  1    SPM  80    Minutes  15    METs  3      Biostep-RELP   Level  2    SPM  50    Minutes  15    METs  3      Prescription Details   Frequency (times per week)  3    Duration  Progress to 30 minutes of continuous aerobic without signs/symptoms of physical distress      Intensity   THRR 40-80% of Max Heartrate  106-162    Ratings of Perceived Exertion  11-13    Perceived Dyspnea  0-4      Resistance  Training   Training Prescription  Yes    Weight  3 lb    Reps  10-15       Perform Capillary Blood Glucose checks as needed.  Exercise Prescription Changes: Exercise Prescription Changes    Row Name 03/10/19 1700 04/02/19 1400  04/13/19 1500 04/15/19 1200 04/30/19 1500     Response to Exercise   Blood Pressure (Admit)  94/52  94/50  --  88/62  98/58   Blood Pressure (Exercise)  94/54  92/66  --  102/62  100/62   Blood Pressure (Exit)  --  94/60  --  104/54  98/60   Heart Rate (Admit)  50 bpm  66 bpm  --  79 bpm  64 bpm   Heart Rate (Exercise)  84 bpm  95 bpm  --  96 bpm  96 bpm   Heart Rate (Exit)  72 bpm  73 bpm  --  76 bpm  61 bpm   Oxygen Saturation (Admit)  99 %  --  --  --  --   Oxygen Saturation (Exercise)  97 %  --  --  --  --   Rating of Perceived Exertion (Exercise)  11  11  --  13  11   Perceived Dyspnea (Exercise)  0  --  --  --  --   Symptoms  leg/foot  pain  plantar fascitis  --  --  none  none   Duration  --  --  --  Continue with 30 min of aerobic exercise without signs/symptoms of physical distress.  Continue with 30 min of aerobic exercise without signs/symptoms of physical distress.   Intensity  --  --  --  THRR unchanged  THRR unchanged     Progression   Progression  --  Continue to progress workloads to maintain intensity without signs/symptoms of physical distress.  --  Continue to progress workloads to maintain intensity without signs/symptoms of physical distress.  Continue to progress workloads to maintain intensity without signs/symptoms of physical distress.   Average METs  --  1.8  --  1.63  1.4     Resistance Training   Training Prescription  --  Yes  --  Yes  Yes   Weight  --  3 lb  --  3 lb  3 lb   Reps  --  10-15  --  10-15  --     Interval Training   Interval Training  --  No  --  No  No     Treadmill   MPH  --  0.5  --  --  --   Grade  --  0  --  --  --   Minutes  --  15  --  --  --     NuStep   Level  --  2  --  3  4   SPM  --  80  --  --  80   Minutes  --  15  --  15  15   METs  --  2.1  --  2  1.2     Arm Ergometer   Level  --  1  --  1  --   RPM  --  25  --  --  --   Minutes  --  15  --  15  --   METs  --  1.7  --  1.5  --     REL-XR   Level  --  --  --  1   1   Speed  --  --  --  --  50   Minutes  --  --  --  15  15   METs  --  --  --  1.4  1.1     Biostep-RELP   Level  --  2  --  --  --   SPM  --  50  --  --  --   Minutes  --  15  --  --  --   METs  --  2  --  --  --     Home Exercise Plan   Plans to continue exercise at  --  --  Home (comment) walking, staff videos  Home (comment) walking, staff videos  --   Frequency  --  --  Add 3 additional days to program exercise sessions.  Add 3 additional days to program exercise sessions.  --   Initial Home Exercises Provided  --  --  04/13/19  04/13/19  --   [place] Name 05/14/19 1400 05/29/19 0900 06/11/19 0900 06/25/19 1100 07/08/19 1400     Response to Exercise   Blood Pressure (Admit)  92/62  90/56  102/62  90/62  88/58   Blood Pressure (Exercise)  104/62  110/66  98/60  134/82  124/70   Blood Pressure (Exit)  90/54  96/66  90/60  100/62  110/70   Heart Rate (Admit)  71 bpm  90 bpm  65 bpm  56 bpm  67 bpm   Heart Rate (Exercise)  95 bpm  130 bpm  99 bpm  139 bpm  108 bpm   Heart Rate (Exit)  93 bpm  104 bpm  81 bpm  50 bpm  76 bpm   Rating of Perceived Exertion (Exercise)  _0 Symptoms  none  none  knee swollen this week  --  none   Duration  Continue with 30 min of aerobic exercise without signs/symptoms of physical distress.  Continue with 30 min of aerobic exercise without signs/symptoms of physical distress.  Continue with 30 min of aerobic exercise without signs/symptoms of physical distress.  Continue with 30 min of aerobic exercise without signs/symptoms of physical distress.  Continue with 30 min of aerobic exercise without signs/symptoms of physical distress.   Intensity  THRR unchanged  THRR unchanged  THRR unchanged  THRR unchanged  THRR unchanged     Progression   Progression  Continue to progress workloads to maintain intensity without signs/symptoms of physical distress.  Continue to progress workloads to maintain intensity without signs/symptoms of physical  distress.  Continue to progress workloads to maintain intensity without signs/symptoms of physical distress.  Continue to progress workloads to maintain intensity without signs/symptoms of physical distress.  Continue to progress workloads to maintain intensity without signs/symptoms of physical distress.   Average METs  1.6  2  1.72  1.8  2.12     Resistance Training   Training Prescription  Yes  Yes  Yes  Yes  Yes   Weight  3 lb  3 lb  3 lb  3 lb  3 lb   Reps  10-15  10-15  10-15  10-15  10-15     Interval Training   Interval Training  No  No  No  No  No     Treadmill   MPH  --  1.5  1.5  1.5  1.5   Grade  --  0  0  0  0   Minutes  --  _0 METs  --  --  2.15  2.15  2.15     Recumbant Elliptical   Level  --  --  1  --  --   Minutes  --  --  15  --  --   METs  --  --  1.2  --  --     Elliptical   Level  --  --  1  --  --   Speed  --  --  2  --  --   Minutes  --  --  4  --  --     REL-XR   Level  --  --  --  1  1   Minutes  --  --  --  15  15   METs  --  --  --  1.4  2.1     T5 Nustep   Level  --  1  1  --  --   SPM  --  80  --  --  --   Minutes  --  15  15  --  --   METs  --  1.8  1.8  --  --     Home Exercise Plan   Plans to continue exercise at  Home (comment) walking, staff videos  Home (comment) walking, staff videos  Home (comment) walking, staff videos  Home (comment) walking, staff videos  Home (comment) walking, staff videos   Frequency  Add 3 additional days to program exercise sessions.  Add 3 additional days to program exercise sessions.  Add 3 additional days to program exercise sessions.  Add 3 additional days to program exercise sessions.  Add 3 additional days to program exercise sessions.   Initial Home Exercises Provided  04/13/19  04/13/19  04/13/19  04/13/19  04/13/19   Row Name 08/06/19 1300 08/18/19 1500           Response to Exercise   Blood Pressure (Admit)  96/54  90/58      Blood Pressure (Exercise)  130/80  120/60      Blood  Pressure (Exit)  104/62  100/60      Heart Rate (Admit)  68 bpm  77 bpm      Heart Rate (Exercise)  96 bpm  87 bpm      Heart Rate (Exit)  79 bpm  73 bpm      Rating of Perceived Exertion (Exercise)  11  12      Symptoms  none  none      Duration  Continue with 30 min of aerobic exercise without signs/symptoms of physical distress.  Continue with 30 min of aerobic exercise without signs/symptoms of physical distress.      Intensity  THRR unchanged  THRR unchanged        Progression   Progression  Continue to progress workloads to maintain intensity without signs/symptoms of physical distress.  Continue to progress workloads to maintain intensity without signs/symptoms of physical distress.      Average METs  2.08  2.1        Resistance Training   Training Prescription  Yes  Yes      Weight  3 lb  3 lb      Reps  10-15  10-15        Interval Training   Interval Training  No  No        Treadmill   MPH  1.5  1.5      Grade  0  0      Minutes  15  15      METs  2.15  2.15        Recumbant Bike   Level  1  --      Minutes  15  --        NuStep   Level  4  --      Minutes  15  --      METs  2  --        Recumbant Elliptical   Level  1  --      Minutes  15  --        REL-XR   Level  --  1      Speed  --  50      Minutes  --  15        Home Exercise Plan   Plans to continue exercise at  Home (comment) walking, staff videos  --      Frequency  Add 3 additional days to program exercise sessions.  --      Initial Home Exercises Provided  04/13/19  --         Exercise Comments: Exercise Comments    Row Name 03/16/19 1536           Exercise Comments  First full day of exercise!  Patient was oriented to gym and equipment including functions, settings, policies, and procedures.  Patient's individual exercise prescription and treatment plan were reviewed.  All starting workloads were established based on the results of the 6 minute walk test done at initial orientation  visit.  The plan for exercise progression was also introduced and progression will be customized based on patient's performance and goals.          Exercise Goals and Review: Exercise Goals    Row Name 03/10/19 1704             Exercise Goals   Increase Physical Activity  Yes       Intervention  Provide advice, education, support and counseling about physical activity/exercise needs.;Develop an individualized exercise prescription for aerobic and resistive training based on initial evaluation findings, risk stratification, comorbidities and participant's personal goals.       Expected Outcomes  Short Term: Attend rehab on a regular basis to increase amount of physical activity.;Long Term: Add in home exercise to make exercise part of routine and to increase amount of physical activity.;Long Term: Exercising regularly at least 3-5 days a week.       Increase Strength and Stamina  Yes       Intervention  Provide advice, education, support and counseling about physical activity/exercise needs.;Develop an individualized exercise prescription for aerobic and resistive training based on initial evaluation findings, risk stratification, comorbidities and participant's personal goals.       Expected Outcomes  Short Term: Increase workloads from initial exercise prescription for resistance, speed, and METs.;Short Term: Perform resistance training exercises routinely during rehab and add in resistance training at home;Long Term: Improve cardiorespiratory fitness, muscular endurance and strength as measured by increased METs and functional capacity (6MWT)       Able to understand and use rate of perceived exertion (RPE) scale  Yes       Intervention  Provide education and explanation on how to use RPE scale  Expected Outcomes  Short Term: Able to use RPE daily in rehab to express subjective intensity level;Long Term:  Able to use RPE to guide intensity level when exercising independently        Knowledge and understanding of Target Heart Rate Range (THRR)  Yes       Intervention  Provide education and explanation of THRR including how the numbers were predicted and where they are located for reference       Expected Outcomes  Short Term: Able to state/look up THRR;Short Term: Able to use daily as guideline for intensity in rehab;Long Term: Able to use THRR to govern intensity when exercising independently       Understanding of Exercise Prescription  Yes       Intervention  Provide education, explanation, and written materials on patient's individual exercise prescription       Expected Outcomes  Short Term: Able to explain program exercise prescription;Long Term: Able to explain home exercise prescription to exercise independently          Exercise Goals Re-Evaluation : Exercise Goals Re-Evaluation    Row Name 03/16/19 1537 04/02/19 1456 04/13/19 1527 04/30/19 1528 05/14/19 1353     Exercise Goal Re-Evaluation   Exercise Goals Review  Increase Physical Activity;Knowledge and understanding of Target Heart Rate Range (THRR);Able to check pulse independently;Understanding of Exercise Prescription;Able to understand and use rate of perceived exertion (RPE) scale  Increase Physical Activity;Increase Strength and Stamina;Able to understand and use rate of perceived exertion (RPE) scale;Able to understand and use Dyspnea scale;Knowledge and understanding of Target Heart Rate Range (THRR);Understanding of Exercise Prescription  Increase Physical Activity;Increase Strength and Stamina;Able to understand and use rate of perceived exertion (RPE) scale;Able to understand and use Dyspnea scale;Knowledge and understanding of Target Heart Rate Range (THRR);Understanding of Exercise Prescription;Able to check pulse independently  Increase Physical Activity;Increase Strength and Stamina;Able to understand and use rate of perceived exertion (RPE) scale;Able to understand and use Dyspnea scale;Knowledge and  understanding of Target Heart Rate Range (THRR);Understanding of Exercise Prescription  Increase Physical Activity;Increase Strength and Stamina;Understanding of Exercise Prescription   Comments  Reviewed RPE scale, THR and program prescription with pt today.  Pt voiced understanding and was given a copy of goals to take home.  Gary Davila is doing well in rehab.  he has had some ankle pain but continues to attend and try all machines.  Staff will monitor progess.  Gary Davila is doing well in rehab.  He has started to be able to move more at home.  He has no real routine going.  Reviewed home exercise with pt today.  Pt plans to walk and use staff videos at home for exercise.  Reviewed THR, pulse, RPE, sign and symptoms, and when to call 911 or MD.  Also discussed weather considerations and indoor options.  Pt voiced understanding.  Gary Davila has increased to 4 lb weight with one hand - other wrist is sprained and he is using 2 lb wieght until it feels better.  He did get new shoes and says he hasnt ad any new foot pain.  Gary Davila is doing well.  He does his daily stretches and walks some with his mom.  He has been able to be more active around the house.  We talked about walking every day at home.  We talked about a goal of walking down here.  Overall, stamina is improving.   Expected Outcomes  Short: Use RPE daily to regulate intensity. Long: Follow program prescription in  THR.  Short - conitnue to attend consistently Long - improve stamina  Short: Start to add in exercise at home.   Long: Continue to improve stamina.  Short - exercise consistently on his own Long : improve overall stamina  Short: Walk more at home and starting walking to class.  Long:  Continue to improve stamina.   [place] Name 05/29/19 9242 06/11/19 0920 06/15/19 1541 06/25/19 1126 07/08/19 1406     Exercise Goal Re-Evaluation   Exercise Goals Review  Increase Physical Activity;Able to understand and use rate of perceived exertion (RPE) scale;Knowledge and  understanding of Target Heart Rate Range (THRR);Understanding of Exercise Prescription;Increase Strength and Stamina;Able to understand and use Dyspnea scale;Able to check pulse independently  Increase Physical Activity;Increase Strength and Stamina;Understanding of Exercise Prescription  Increase Physical Activity;Increase Strength and Stamina;Understanding of Exercise Prescription  Increase Physical Activity;Increase Strength and Stamina;Able to understand and use rate of perceived exertion (RPE) scale;Able to understand and use Dyspnea scale;Knowledge and understanding of Target Heart Rate Range (THRR);Able to check pulse independently;Understanding of Exercise Prescription  Increase Physical Activity;Increase Strength and Stamina;Understanding of Exercise Prescription   Comments  Gary Davila states he stamina is getting better.  He can walk up stairs at home unassisted and can stand the whole time in the shower.  He walks outside at home.  Gary Davila has been doing better in rehab.  He has actually been able to tackle the elliptical some.  He was not able to increase this week as his knee swelled up and he was not able to walk as well.  We will continue to monitor his progress.  Gary Davila is doing well in rehab.  He is getting in some exercise/activity by going up and down stairs and feeding dog.  His goal was to walk to and from rehab and he is now doing it!  Gary Davila has stated he feels he is getting stronger.  He does continue to walk down, exercise and walk back up.  Gary Davila continues to improve in rehab.  He is up to 2.1 METs on the XR.  We will continue to monitor his progress.   Expected Outcomes  Short - continue to be active at home Long : walk down to rehab gym before and after exercise  Short: Increase speed on treadmill and seated equipment.  Long: Continue to improve stamina.  Short: More activity at home.  Long: Continue to improve stamina.  Short:  more active at home Long: improve overall stamina  Short: Continue to  try to increase speed on treadmill  Long: Continue to move more at home.   [place] Name 07/13/19 1548 07/22/19 1536 08/03/19 1604 08/06/19 1347 08/18/19 1551     Exercise Goal Re-Evaluation   Exercise Goals Review  Increase Physical Activity;Increase Strength and Stamina  --  Increase Physical Activity;Increase Strength and Stamina;Understanding of Exercise Prescription  Increase Physical Activity;Increase Strength and Stamina;Understanding of Exercise Prescription  Increase Physical Activity;Increase Strength and Stamina;Able to understand and use rate of perceived exertion (RPE) scale;Able to understand and use Dyspnea scale;Knowledge and understanding of Target Heart Rate Range (THRR);Able to check pulse independently;Understanding of Exercise Prescription   Comments  Gary Davila has got resistance bands that he obtained from rehab and is able to do some stretches at home. When he is done with the program Gary Davila is thinking about going to [employer].  Out since last review with swollen knee  Gary Davila has returned to class and his knee is feeling better.  He has been using  his exercise band at home and walking some at home.  His strength is improving.  Gary Davila continues ot improve and admitted on phone today that he can tell that rehab is helping him.  He is now able to walk down here and more on the treadmill.  His feet and knee are his biggest limitation. We will continue to monitor his progress.  Gary Davila will complete HT program in 6 sessions.  He has made a lot of progress in the program.   Expected Outcomes  Short: due more home exercises. Long: Work on exercise at [place]: Continue to add in more exercise at home.  Long; Continue to improve stamina.  Short: Continue to add more time on treadmill Long: Continue to improve stamina.  Short : continue to walk as much as possible Long:  complete HT program      Discharge Exercise Prescription (Final Exercise Prescription Changes): Exercise Prescription  Changes - 08/18/19 1500      Response to Exercise   Blood Pressure (Admit)  90/58    Blood Pressure (Exercise)  120/60    Blood Pressure (Exit)  100/60    Heart Rate (Admit)  77 bpm    Heart Rate (Exercise)  87 bpm    Heart Rate (Exit)  73 bpm    Rating of Perceived Exertion (Exercise)  12    Symptoms  none    Duration  Continue with 30 min of aerobic exercise without signs/symptoms of physical distress.    Intensity  THRR unchanged      Progression   Progression  Continue to progress workloads to maintain intensity without signs/symptoms of physical distress.    Average METs  2.1      Resistance Training   Training Prescription  Yes    Weight  3 lb    Reps  10-15      Interval Training   Interval Training  No      Treadmill   MPH  1.5    Grade  0    Minutes  15    METs  2.15      REL-XR   Level  1    Speed  50    Minutes  15       Nutrition:  Target Goals: Understanding of nutrition guidelines, daily intake of sodium <1560m, cholesterol <2079m calories 30% from fat and 7% or less from saturated fats, daily to have 5 or more servings of fruits and vegetables.  Education: Controlling Sodium/Reading Food Labels -Group verbal and written material supporting the discussion of sodium use in heart healthy nutrition. Review and explanation with models, verbal and written materials for utilization of the food label.   Education: General Nutrition Guidelines/Fats and Fiber: -Group instruction provided by verbal, written material, models and posters to present the general guidelines for heart healthy nutrition. Gives an explanation and review of dietary fats and fiber.   Biometrics:    Nutrition Therapy Plan and Nutrition Goals: Nutrition Therapy & Goals - 04/02/19 1102      Nutrition Therapy   Diet  Low Na, HH diet    Drug/Food Interactions  Coumadin/Vit K    Protein (specify units)  75g    Fiber  30 grams    Whole Grain Foods  3 servings    Saturated Fats  12  max. grams    Fruits and Vegetables  5 servings/day    Sodium  1.5 grams      Personal Nutrition  Goals   Nutrition Goal  ST: switch out one glass of sugar-sweetened beverage with water LT: increase strength and muscle mass    Comments  B: bowl of cereal cheerios, cinnamon toast crunch, rice krispies (whole milk). L: chicken with vegetables sometimes D: varies (meat, starch, vegetable) - red meat or poultry. Drinks: sweet tea or soda. 2-3 glasses/can. Discussed HH eating and MNT for muscle building.      Intervention Plan   Intervention  Prescribe, educate and counsel regarding individualized specific dietary modifications aiming towards targeted core components such as weight, hypertension, lipid management, diabetes, heart failure and other comorbidities.;Nutrition handout(s) given to patient.    Expected Outcomes  Short Term Goal: Understand basic principles of dietary content, such as calories, fat, sodium, cholesterol and nutrients.;Short Term Goal: A plan has been developed with personal nutrition goals set during dietitian appointment.;Long Term Goal: Adherence to prescribed nutrition plan.       Nutrition Assessments:   MEDIFICTS Score Key:          ?70 Need to make dietary changes          40-70 Heart Healthy Diet         ? 40 Therapeutic Level Cholesterol Diet  Nutrition Goals Re-Evaluation: Nutrition Goals Re-Evaluation    [place] Name 04/30/19 1346 06/08/19 1608 07/09/19 1712         Goals   Nutrition Goal  ST: switch out one glass of sugar-sweetened beverage with water LT: increase strength and muscle mass  ST: switch out one glass of sugar-sweetened beverage with water LT: increase strength and muscle mass  ST: switch out one glass of sugar-sweetened beverage with water LT: increase strength and muscle mass     Comment  Pt reports thinks his strength is coming back.  Some of the time is switching, pt reports he just needs to get used to it and will continue to work on it. Pt  reports strength is ok when legs arent in pain.  Continue with current changes     Expected Outcome  --  ST: switch out one glass of sugar-sweetened beverage with water LT: increase strength and muscle mass  ST: switch out one glass of sugar-sweetened beverage with water LT: increase strength and muscle mass        Nutrition Goals Discharge (Final Nutrition Goals Re-Evaluation): Nutrition Goals Re-Evaluation - 07/09/19 1712      Goals   Nutrition Goal  ST: switch out one glass of sugar-sweetened beverage with water LT: increase strength and muscle mass    Comment  Continue with current changes    Expected Outcome  ST: switch out one glass of sugar-sweetened beverage with water LT: increase strength and muscle mass       Psychosocial: Target Goals: Acknowledge presence or absence of significant depression and/or stress, maximize coping skills, provide positive support system. Participant is able to verbalize types and ability to use techniques and skills needed for reducing stress and depression.   Education: Depression - Provides group verbal and written instruction on the correlation between heart/lung disease and depressed mood, treatment options, and the stigmas associated with seeking treatment.   Education: Sleep Hygiene -Provides group verbal and written instruction about how sleep can affect your health.  Define sleep hygiene, discuss sleep cycles and impact of sleep habits. Review good sleep hygiene tips.     Education: Stress and Anxiety: - Provides group verbal and written instruction about the health risks of elevated stress and causes of high  stress.  Discuss the correlation between heart/lung disease and anxiety and treatment options. Review healthy ways to manage with stress and anxiety.    Initial Review & Psychosocial Screening:   Quality of Life Scores:  Quality of Life - 03/10/19 1706      Quality of Life   Select  Quality of Life      Quality of Life Scores    Health/Function Pre  20.43 %    Socioeconomic Pre  23.29 %    Psych/Spiritual Pre  21.83 %    Family Pre  26.67 %    GLOBAL Pre  21.95 %      Scores of 19 and below usually indicate a poorer quality of life in these areas.  A difference of  2-3 points is a clinically meaningful difference.  A difference of 2-3 points in the total score of the Quality of Life Index has been associated with significant improvement in overall quality of life, self-image, physical symptoms, and general health in studies assessing change in quality of life.  PHQ-9: Recent Review Flowsheet Data    Depression screen [facility] 2/9 04/29/2019 03/10/2019 03/04/2019   Decreased Interest 0 0 0   Down, Depressed, Hopeless 0 0 0   PHQ - 2 Score 0 0 0   Altered sleeping - 0 -   Tired, decreased energy - 1 -   Change in appetite - 0 -   Feeling bad or failure about yourself  - 0 -   Trouble concentrating - 0 -   Moving slowly or fidgety/restless - 3 -   Suicidal thoughts - 0 -   PHQ-9 Score - 4 -   Difficult doing work/chores - Somewhat difficult -     Interpretation of Total Score  Total Score Depression Severity:  1-4 = Minimal depression, 5-9 = Mild depression, 10-14 = Moderate depression, 15-19 = Moderately severe depression, 20-27 = Severe depression   Psychosocial Evaluation and Intervention:   Psychosocial Re-Evaluation: Psychosocial Re-Evaluation    Row Name 04/13/19 1530 05/14/19 1355 06/15/19 1543 07/13/19 1551 08/03/19 1606     Psychosocial Re-Evaluation   Current issues with  Current Depression;Current Stress Concerns  Current Depression;Current Stress Concerns  Current Depression;Current Stress Concerns  None Identified  Current Stress Concerns   Comments  Gary Davila's family is very involved in his life.  His aunt has started to come to class with him, but we will try to keep them separated to let him have some time to himself.  She tried to get him to get a cortizone shot for his wrist he injured.  He is  starting to want some more independence and has been more active at home.  Overall, he is feeling better overall.  Gary Davila is doing better overall.  He is still being pulled around by his aunt and she is causing some tension.  He is also trying  to watch out for his mom as well. He denies depression or anxiety symptoms.  He is already planning to join gym at [employer].  Gary Davila is doing well in rehab. He is glad to have some freedom from his aunt.  He and his mom are doing okay.  He needs to be more active and get out of house.  He wants to get back to his house.  He is going to start to take the steps to move back out on his own.  Gary Davila is trying to stay positive in Cardiac rehab. He knows why he has to  do rehab to keep his heart healthy and decrease stress. He states that he has no stressors at this time.  Gary Davila is doing fairly well in rehab.  His aunt is still bothering his mom which upsets.  His dog recently passed which had him saddened.  They have someone drive through their yard and police showing up which got his mom scared so he was not able to feel like he could move out yet.  He is sleeping pretty good.   Expected Outcomes  Short: Continue to attend class to build indepence  Long: Continue stay positive.  Short: Continue to attend class to build indepence  Long: Continue stay positive.  Short: Add more exercise to build stamina. Long: Move out!  Short: Attend [facility] stress management education to decrease stress. Long: Maintain exercise Post [facility] to keep stress at a minimum.  Short: Continue to cope with aunt positively  Long: Continue to exercise for more independence.   Interventions  Encouraged to attend Cardiac Rehabilitation for the exercise  Encouraged to attend Cardiac Rehabilitation for the exercise  Encouraged to attend Cardiac Rehabilitation for the exercise  Encouraged to attend Cardiac Rehabilitation for the exercise  Encouraged to attend Cardiac Rehabilitation for the exercise    Continue Psychosocial Services   Follow up required by staff  --  Follow up required by staff  Follow up required by staff  --   Comments  Been dealing with heart issues since birth, first noted in 2005  --  --  --  --     Initial Review   Source of Stress Concerns  Chronic Illness  --  --  --  --      Psychosocial Discharge (Final Psychosocial Re-Evaluation): Psychosocial Re-Evaluation - 08/03/19 1606      Psychosocial Re-Evaluation   Current issues with  Current Stress Concerns    Comments  Gary Davila is doing fairly well in rehab.  His aunt is still bothering his mom which upsets.  His dog recently passed which had him saddened.  They have someone drive through their yard and police showing up which got his mom scared so he was not able to feel like he could move out yet.  He is sleeping pretty good.    Expected Outcomes  Short: Continue to cope with aunt positively  Long: Continue to exercise for more independence.    Interventions  Encouraged to attend Cardiac Rehabilitation for the exercise       Vocational Rehabilitation: Provide vocational rehab assistance to qualifying candidates.   Vocational Rehab Evaluation & Intervention:   Education: Education Goals: Education classes will be provided on a variety of topics geared toward better understanding of heart health and risk factor modification. Participant will state understanding/return demonstration of topics presented as noted by education test scores.  Learning Barriers/Preferences:   General Cardiac Education Topics:  AED/CPR: - Group verbal and written instruction with the use of models to demonstrate the basic use of the AED with the basic ABC's of resuscitation.   Anatomy & Physiology of the Heart: - Group verbal and written instruction and models provide basic cardiac anatomy and physiology, with the coronary electrical and arterial systems. Review of Valvular disease and Heart Failure   Cardiac Procedures: - Group  verbal and written instruction to review commonly prescribed medications for heart disease. Reviews the medication, class of the drug, and side effects. Includes the steps to properly store meds and maintain the prescription regimen. (beta blockers and nitrates)   Cardiac  Medications I: - Group verbal and written instruction to review commonly prescribed medications for heart disease. Reviews the medication, class of the drug, and side effects. Includes the steps to properly store meds and maintain the prescription regimen.   Cardiac Medications II: -Group verbal and written instruction to review commonly prescribed medications for heart disease. Reviews the medication, class of the drug, and side effects. (all other drug classes)    Go Sex-Intimacy & Heart Disease, Get SMART - Goal Setting: - Group verbal and written instruction through game format to discuss heart disease and the return to sexual intimacy. Provides group verbal and written material to discuss and apply goal setting through the application of the S.M.A.R.T. Method.   Other Matters of the Heart: - Provides group verbal, written materials and models to describe Stable Angina and Peripheral Artery. Includes description of the disease process and treatment options available to the cardiac patient.   Infection Prevention: - Provides verbal and written material to individual with discussion of infection control including proper hand washing and proper equipment cleaning during exercise session.   Cardiac Rehab from 03/26/2019 in [facility] Cardiac and Pulmonary Rehab  Date  03/10/19  Educator  AS  Instruction Review Code  1- Verbalizes Understanding      Falls Prevention: - Provides verbal and written material to individual with discussion of falls prevention and safety.   Cardiac Rehab from 03/26/2019 in [facility] Cardiac and Pulmonary Rehab  Date  03/10/19  Educator  AS  Instruction Review Code  1- Verbalizes Understanding       Other: -Provides group and verbal instruction on various topics (see comments)   Knowledge Questionnaire Score:   Core Components/Risk Factors/Patient Goals at Admission: Personal Goals and Risk Factors at Admission - 03/10/19 1706      Core Components/Risk Factors/Patient Goals on Admission    Weight Management  Yes;Obesity;Weight Loss    Intervention  Weight Management: Develop a combined nutrition and exercise program designed to reach desired caloric intake, while maintaining appropriate intake of nutrient and fiber, sodium and fats, and appropriate energy expenditure required for the weight goal.;Weight Management: Provide education and appropriate resources to help participant work on and attain dietary goals.;Weight Management/Obesity: Establish reasonable short term and long term weight goals.;Obesity: Provide education and appropriate resources to help participant work on and attain dietary goals.    Admit Weight  199 lb 8 oz (90.5 kg)    Goal Weight: Short Term  195 lb (88.5 kg)    Goal Weight: Long Term  190 lb (86.2 kg)    Expected Outcomes  Short Term: Continue to assess and modify interventions until short term weight is achieved;Long Term: Adherence to nutrition and physical activity/exercise program aimed toward attainment of established weight goal;Weight Loss: Understanding of general recommendations for a balanced deficit meal plan, which promotes 1-2 lb weight loss per week and includes a negative energy balance of [phone] kcal/d;Understanding recommendations for meals to include 15-35% energy as protein, 25-35% energy from fat, 35-60% energy from carbohydrates, less than 287m of dietary cholesterol, 20-35 gm of total fiber daily;Understanding of distribution of calorie intake throughout the day with the consumption of 4-5 meals/snacks       Education:Diabetes - Individual verbal and written instruction to review signs/symptoms of diabetes, desired ranges of glucose  level fasting, after meals and with exercise. Acknowledge that pre and post exercise glucose checks will be done for 3 sessions at entry of program.   Education: Know Your Numbers and Risk Factors: -  Group verbal and written instruction about important numbers in your health.  Discussion of what are risk factors and how they play a role in the disease process.  Review of Cholesterol, Blood Pressure, Diabetes, and BMI and the role they play in your overall health.   Core Components/Risk Factors/Patient Goals Review:  Goals and Risk Factor Review    Row Name 04/13/19 1534 05/14/19 1358 06/15/19 1545 07/13/19 1554 08/03/19 1608     Core Components/Risk Factors/Patient Goals Review   Personal Goals Review  Weight Management/Obesity;Heart Failure;Hypertension  Weight Management/Obesity;Heart Failure;Hypertension  Weight Management/Obesity;Heart Failure;Hypertension  Weight Management/Obesity;Other;Heart Failure  Weight Management/Obesity;Heart Failure   Review  When Gary Davila first started, he lost a lot of weight very quickly.  It has now since stabilized and he is doing well now.  His pressures continue to do well.  He has not had symptoms of heart failure. He injured his wrist and it is swollen some. He is using ice and heat to treat  Gary Davila is doing well with his weight staying steady.  He has not had any heart failure symptoms. He is doing well on his medications. His pressures have continued to do well.  Gary Davila is doing well in rehab.  His weight is staying stedy. He has not had any edema or SOB above his normal.  Meds are going well.  Pressures have been good here in class as he does not have a cuff to check it at home.  Patient states that he would like to lose more weight. He has lost about 8 pounds since the start of the program. He also has had no signs of hypertension in class. The last time he talked to his cardiologist he states that there is nothing new going on with his heart and everything looks good.  Gary Davila is watching is fluids and wants to lose more weight in the next couple of weeks,  Gary Davila has been doing well.  His weight is staying steady around 190 lb.  He has not had any heart failure symptoms.  Blood pressures have been good.  Overall he is good.   Expected Outcomes  Short: Continue to improve strength and maintain weight.  Long: Continue to manage heart failure.  Short: Continue to maintain weight.  Long: Continue to manage heart failure.  Short: Continue to maintain weight and start checking BP.  Long: Continue to manage heart failure.  Short: lose 5 pounds in the next couple weeks. Long: reach an obtainable weight goal.  Short: Continue to work on weight loss Long: continue to improve strength for independence.      Core Components/Risk Factors/Patient Goals at Discharge (Final Review):  Goals and Risk Factor Review - 08/03/19 1608      Core Components/Risk Factors/Patient Goals Review   Personal Goals Review  Weight Management/Obesity;Heart Failure    Review  Gary Davila has been doing well.  His weight is staying steady around 190 lb.  He has not had any heart failure symptoms.  Blood pressures have been good.  Overall he is good.    Expected Outcomes  Short: Continue to work on weight loss Long: continue to improve strength for independence.       ITP Comments: ITP Comments    Row Name 03/16/19 1536 03/16/19 1551 03/25/19 1059 04/02/19 1132 04/22/19 0925   ITP Comments  First full day of exercise!  Patient was oriented to gym and equipment including functions, settings, policies, and procedures.  Patient's individual exercise prescription and treatment plan were  reviewed.  All starting workloads were established based on the results of the 6 minute walk test done at initial orientation visit.  The plan for exercise progression was also introduced and progression will be customized based on patient's performance and goals.  Saw podiatrist, diagnosed with Plantar Fasciitis. Received cortisone  shots and pain has been relieved.  30 day review competed . ITP sent to Dr Emily Filbert for review, changes as needed and ITP approval signature.  Completed Initial RD Eval  30 day review competed . ITP sent to Dr Emily Filbert for review, changes as needed and ITP approval signature   [place] Name 05/20/19 1507 06/17/19 0705 07/15/19 1040 07/20/19 1446 08/06/19 1242   ITP Comments  30 day review completed. ITP sent to Dr. Emily Filbert, Medical Director of Cardiac and Pulmonary Rehab. Continue with ITP unless changes are made by physician.  Department operating under reduced schedule until further notice by request from hospital leadership.  30 day chart review completed. ITP sent to Dr Zachery Dakins Medical Director, for review,changes as needed and signature.  30 day chart review completed. ITP sent to Dr Zachery Dakins Medical Director, for review,changes as needed and signature.  Gary Davila called to let us know that he will be out again today.  His knee is still swollen and hurting, but improved since last week.  He is still not sure how exactly it ended up swollen.  He also mentioned that his dog passed last week, so he has been having a rough time dealing with that as well.  Gary Davila's knee is swollen again and he will not be here.  Encouraged him to call doctor.   [place] Name 08/12/19 [phone] 08/27/19 1531         ITP Comments  30 Day review completed. Medical Director review done, changes made as directed,and approval shown by signature of Market researcher.  Weylyn graduated today from  rehab with 33 sessions completed.  Details of the patient's exercise prescription and what He needs to do in order to continue the prescription and progress were discussed with patient.  Patient was given a copy of prescription and goals.  Patient verbalized understanding.  Waleed plans to continue to exercise by walking at home.         Comments: Discharge ITP

## 2019-11-10 ENCOUNTER — Ambulatory Visit (INDEPENDENT_AMBULATORY_CARE_PROVIDER_SITE_OTHER): Payer: Medicare Other | Admitting: Family Medicine

## 2019-11-10 ENCOUNTER — Encounter: Payer: Self-pay | Admitting: Family Medicine

## 2019-11-10 ENCOUNTER — Other Ambulatory Visit: Payer: Self-pay

## 2019-11-10 VITALS — BP 86/54 | HR 68 | Temp 97.5°F | Resp 16 | Ht 68.0 in | Wt 201.6 lb

## 2019-11-10 DIAGNOSIS — I482 Chronic atrial fibrillation, unspecified: Secondary | ICD-10-CM | POA: Diagnosis not present

## 2019-11-10 DIAGNOSIS — I420 Dilated cardiomyopathy: Secondary | ICD-10-CM

## 2019-11-10 DIAGNOSIS — M79671 Pain in right foot: Secondary | ICD-10-CM

## 2019-11-10 DIAGNOSIS — R600 Localized edema: Secondary | ICD-10-CM

## 2019-11-10 DIAGNOSIS — M79672 Pain in left foot: Secondary | ICD-10-CM

## 2019-11-10 DIAGNOSIS — M7989 Other specified soft tissue disorders: Secondary | ICD-10-CM

## 2019-11-10 NOTE — Progress Notes (Signed)
Subjective:    Patient ID: Gary Davila, male    DOB: [birth year], 29 y.o.   MRN: [id number]  Gary Davila is a 29 y.o. male presenting on 11/10/2019 for Foot Swelling (both but pain on left side onset week)   HPI   Congestive Heart Failure with Reduced EF, secondary to NICM, Coarctation of the Aorta Bilateral Foot / Lower Extremity Edema History of Plantar Fasciitis Bilateral - resolved Followed by [facility] Cardiology Dr Edwena Blow and Dr Jackson Latino (Peds Cards) Background history since age 54, diagnosed with heart disease after he had a bad cold that did not resolve - Prior ECHO 2019, shows severely dilated LV with EF 15% - History of blood clot in heart, required chronic anticoagulation, on coumadin rx by Cardiology - Admits chronic swelling, in lower extremity  Followed by [facility], 02/2019, treated with custom orthotic shoe inserts, new shoes, and also treated with plantar fascia injection with cortisone. - Has significantly improved from the heel pain and plantar fascia. - He used to be able to stand on feet for up to 8 hours at a time and function and work, now it seems since that treatment he has had more difficulty with foot swelling - now gradual worsening in past 6+ or more months, has had difficulty standing prolong for 15+ minutes.  Today now he describes swelling and pain or pressure on top of foot, Left is worse than Right, onset about 1-2+ weeks, unsure exact. No new injury or fall affecting his feet.   - However on further discussion, he does report that in 08/2019 his [facility] reduced his furosemide lasix from 40mg  BID down to 40mg  daily, he had lost weight, wt down to 175 lbs. Now today his weight is back up to 201 lbs. He had improved with cardiac rehab and lost weight, and also his BP was lower, he was reduced on Carvedilol to 12.5mg  BID. - His next apt with [facility] is 12/23/19.   - Admits a generalized weakness in lower extremities and muscles  generally, limited activity short distances walking due to severity of his heart failure. - He uses cane to ambulate - Admits some pain in feet at times as above, now heel no longer painful  Denies any dyspnea, cough, swelling warmth fever chills  Depression screen [facility] 2/9 04/29/2019 03/10/2019 03/04/2019  Decreased Interest 0 0 0  Down, Depressed, Hopeless 0 0 0  PHQ - 2 Score 0 0 0  Altered sleeping - 0 -  Tired, decreased energy - 1 -  Change in appetite - 0 -  Feeling bad or failure about yourself  - 0 -  Trouble concentrating - 0 -  Moving slowly or fidgety/restless - 3 -  Suicidal thoughts - 0 -  PHQ-9 Score - 4 -  Difficult doing work/chores - Somewhat difficult -    Social History   Tobacco Use  . Smoking status: Never Smoker  . Smokeless tobacco: Never Used  Vaping Use  . Vaping Use: Never used  Substance Use Topics  . Alcohol use: Yes  . Drug use: No    Review of Systems Per HPI unless specifically indicated above     Objective:    BP (!) 86/54   Pulse 68   Temp (!) 97.5 F (36.4 C) (Temporal)   Resp 16   Ht 5\' 8"  (1.727 m)   Wt 201 lb 9.6 oz (91.4 kg)   SpO2 99%   BMI 30.65 kg/m  Wt Readings from Last 3 Encounters:  11/10/19 201 lb 9.6 oz (91.4 kg)  03/04/19 203 lb (92.1 kg)  02/28/17 214 lb (97.1 kg)    Physical Exam Vitals and nursing note reviewed.  Constitutional:      General: He is not in acute distress.    Appearance: He is well-developed. He is not diaphoretic.     Comments: Well-appearing, comfortable, cooperative  HENT:     Head: Normocephalic and atraumatic.  Eyes:     General:        Right eye: No discharge.        Left eye: No discharge.     Conjunctiva/sclera: Conjunctivae normal.  Cardiovascular:     Rate and Rhythm: Normal rate. Rhythm irregular.  Pulmonary:     Effort: Pulmonary effort is normal.  Musculoskeletal:     Right lower leg: Edema (+1 pitting edema ankle to foot) present.     Left lower leg: Edema (+1-2  pitting edema ankle to foot) present.     Comments: No erythema of lower extremity  Slight muscle atrophy of lower extremities bilateral.  Muscle strength knee flex ext 4/5 with some limited effort.  Foot exam Bilateral foot edema. No erythema. No ulceration. Left side with non tender palpation on top of left foot dorsal. Heels and achilles non tender. Has some abnormal arch anatomy standing barefoot weightbearing, some loss of arch.  Skin:    General: Skin is warm and dry.     Findings: No erythema or rash.  Neurological:     Mental Status: He is alert and oriented to person, place, and time.  Psychiatric:        Behavior: Behavior normal.     Comments: Well groomed, good eye contact, normal speech and thoughts      Results for orders placed or performed in visit on 05/06/17  CoaguChek XS/INR Waived  Result Value Ref Range   INR 4.4 (H) 0.9 - 1.1   Prothrombin Time 53.4 sec      Assessment & Plan:   Problem List Items Addressed This Visit    Dilated cardiomyopathy (HCC)   Relevant Medications   sacubitril-valsartan (ENTRESTO) 24-26 MG   furosemide (LASIX) 40 MG tablet   A-fib (HCC)   Relevant Medications   sacubitril-valsartan (ENTRESTO) 24-26 MG   furosemide (LASIX) 40 MG tablet    Other Visit Diagnoses    Swelling of left foot    -  Primary   Relevant Medications   furosemide (LASIX) 40 MG tablet   Bilateral lower extremity edema       Relevant Medications   furosemide (LASIX) 40 MG tablet   Bilateral foot pain          Review outside records from prior PCP and Cardiologist at [facility]  Last visit 08/2019.  Complex chronic history of cardiovascular disease S/p AICD Followed by [facility] Cardiology On medication management for volume control / fluid balance / CHF management Anticoagulation with coumadin, per Cardiology He is off jardiance, not covered by ins. Improved off Cardiac rehab.  Resolved plantar fasciitis from podiatry [facility] - cortisone  injection.  Evidence of volume overload today but no acute CHF. No dyspnea, good air movement some diminished at bases. No significant crackles.  Plan Double dose Furosemide 40mg  for now, take 40 BID (2nd dose in early afternoon) for up to 1 week to trial to see if weight loss back to dry weight of 175 approx to see if may help reduce his foot swelling  which would likely cause his dorsal foot pain, seems shoes pressing on feet and weakness with standing and pain.  He should use existing medicine to try double dose, then he can contact [facility] for further guidance, may need adjust other medications, caution low BP, should monitor BP closely.  Follow-up sooner if need. Next step would be Podiatry if fluid is not affecting his foot swelling or pain. May need additional arch support treatment if now shift in pain in feet.  No orders of the defined types were placed in this encounter.   Follow up plan: Return in about 3 months (around 02/10/2020) for 4-6 weeks sooner if need. Follow-up Heart / Swelling within 3 months as planned.   Saralyn Pilar, DO [facility] 11/10/2019, 1:51 PM

## 2019-11-10 NOTE — Patient Instructions (Addendum)
Thank you for coming to the office today.  Double the Furosemide Lasix from 40mg  once daily, now go to 40mg  twice daily (morning and early afternoon) - use existing medication, please notify the Cardiologist soon to determine if they are okay with changing your fluid medication, next apt is 12/23/19.  Glad your plantar fasciitis has improved - and if still not better after fluid pill, then call you can call your Cardiologist.  Weight trend, down to 175 lbs then now back up to 201.   Please schedule a Follow-up Appointment to: Return in about 3 months (around 02/10/2020) for 4-6 weeks sooner if need. Follow-up Heart / Swelling within 3 months as planned.  If you have any other questions or concerns, please feel free to call the office or send a message through MyChart. You may also schedule an earlier appointment if necessary.  Additionally, you may be receiving a survey about your experience at our office within a few days to 1 week by e-mail or mail. We value your feedback.  02/22/20, DO [facility], Saralyn Pilar

## 2020-01-11 ENCOUNTER — Ambulatory Visit (INDEPENDENT_AMBULATORY_CARE_PROVIDER_SITE_OTHER): Payer: Medicare Other | Admitting: Family Medicine

## 2020-01-11 ENCOUNTER — Encounter: Payer: Self-pay | Admitting: Family Medicine

## 2020-01-11 ENCOUNTER — Other Ambulatory Visit: Payer: Self-pay

## 2020-01-11 VITALS — BP 80/46 | HR 68 | Temp 97.5°F | Resp 16 | Ht 68.0 in | Wt 206.8 lb

## 2020-01-11 DIAGNOSIS — I420 Dilated cardiomyopathy: Secondary | ICD-10-CM

## 2020-01-11 DIAGNOSIS — Z23 Encounter for immunization: Secondary | ICD-10-CM | POA: Diagnosis not present

## 2020-01-11 DIAGNOSIS — L97911 Non-pressure chronic ulcer of unspecified part of right lower leg limited to breakdown of skin: Secondary | ICD-10-CM

## 2020-01-11 DIAGNOSIS — R600 Localized edema: Secondary | ICD-10-CM | POA: Diagnosis not present

## 2020-01-11 DIAGNOSIS — F410 Panic disorder [episodic paroxysmal anxiety] without agoraphobia: Secondary | ICD-10-CM

## 2020-01-11 MED ORDER — HYDROXYZINE HCL 10 MG PO TABS: 10.0000 mg | ORAL_TABLET | Freq: Three times a day (TID) | ORAL | 0 refills | Status: AC | PRN

## 2020-01-11 MED ORDER — CEPHALEXIN 500 MG PO CAPS: 500.0000 mg | ORAL_CAPSULE | Freq: Three times a day (TID) | ORAL | 0 refills | Status: DC

## 2020-01-11 MED ORDER — MUPIROCIN 2 % EX OINT: 1.0000 "application " | TOPICAL_OINTMENT | Freq: Two times a day (BID) | CUTANEOUS | 0 refills | Status: AC

## 2020-01-11 NOTE — Progress Notes (Signed)
Subjective:    Patient ID: Gary Davila, male    DOB: 1991/02/18, 29 y.o.   MRN: [id number]  Gary Davila is a 29 y.o. male presenting on 01/11/2020 for Edema (legs and feet onset month)   HPI   Bilateral LE Edema Cat Scratch / Superficial ulceration R leg Congestive Heart Failure with Reduced EF, secondary to NICM, Coarctation of the Aorta Bilateral Foot / Lower Extremity Edema Followed by [facility] Cardiology Dr Edwena Blow and Dr Jackson Latino (Peds Cards) Background history since age 29, diagnosed with heart disease after he had a bad cold that did not resolve - Prior ECHO 2019, shows severely dilated LV with EF 15% - History of blood clot in heart, required chronic anticoagulation, on coumadin rx by Cardiology - Admits chronic swelling, in lower extremity  His diuretic was adjusted last time for 40mg  BID for 1 week, some inc urine output but did not resolve his swelling  He used to take Lasix 20mg  x2 = 40mg  twice a day, now he has been on 40 x 2 in AM vs BID  He still has significant swelling, he has not returned to Cardiology. Next apt 02/2020, he missed last apt due to anxiety issue.  - He used to be able to stand on feet for up to 8 hours at a time and function and work, now it seems since that treatment he has had more difficulty with foot swelling - now gradual worsening in past 6+ or more months, has had difficulty standing prolong for 15+ minutes.  Today now he describes swelling and pain or pressure on top of foot, Left is worse than Right, onset about 1-2+ weeks, unsure exact. No new injury or fall affecting his feet.   - Admits a generalized weakness in lower extremities and muscles generally, limited activity short distances walking due to severity of his heart failure. - He uses cane to ambulate - Admits some pain in feet at times as above, now heel no longer painful  Denies any dyspnea, cough, swelling warmth fever chills   Panic Attacks Reports episode unprovoked, has  resolved.  Health Maintenance: Declines COVID19 vaccine Due for Flu Shot, will receive today    Depression screen [facility] 2/9 04/29/2019 03/10/2019 03/04/2019  Decreased Interest 0 0 0  Down, Depressed, Hopeless 0 0 0  PHQ - 2 Score 0 0 0  Altered sleeping - 0 -  Tired, decreased energy - 1 -  Change in appetite - 0 -  Feeling bad or failure about yourself  - 0 -  Trouble concentrating - 0 -  Moving slowly or fidgety/restless - 3 -  Suicidal thoughts - 0 -  PHQ-9 Score - 4 -  Difficult doing work/chores - Somewhat difficult -   No flowsheet data found.    Social History   Tobacco Use  . Smoking status: Never Smoker  . Smokeless tobacco: Never Used  Vaping Use  . Vaping Use: Never used  Substance Use Topics  . Alcohol use: Yes  . Drug use: No    Review of Systems Per HPI unless specifically indicated above     Objective:    BP (!) 80/46   Pulse 68   Temp (!) 97.5 F (36.4 C) (Temporal)   Resp 16   Ht 5\' 8"  (1.727 m)   Wt 206 lb 12.8 oz (93.8 kg)   SpO2 98%   BMI 31.44 kg/m   Wt Readings from Last 3 Encounters:  01/11/20 206 lb 12.8 oz (  93.8 kg)  11/10/19 201 lb 9.6 oz (91.4 kg)  03/04/19 203 lb (92.1 kg)    Physical Exam Vitals and nursing note reviewed.  Constitutional:      General: He is not in acute distress.    Appearance: He is well-developed. He is not diaphoretic.     Comments: Well-appearing, comfortable, cooperative  HENT:     Head: Normocephalic and atraumatic.  Eyes:     General:        Right eye: No discharge.        Left eye: No discharge.     Conjunctiva/sclera: Conjunctivae normal.  Cardiovascular:     Rate and Rhythm: Normal rate. Rhythm irregular.  Pulmonary:     Effort: Pulmonary effort is normal.  Musculoskeletal:     Right lower leg: Edema (+2 pitting edema ankle to foot) present.     Left lower leg: Edema (+2 pitting edema ankle to foot) present.     Comments: No erythema of lower extremity  Slight muscle atrophy of lower  extremities bilateral.  Muscle strength knee flex ext 4/5 with some limited effort  Skin:    General: Skin is warm and dry.     Findings: No erythema or rash.     Comments: Right inner lower extremity lower leg with superficial ulceration and local erythema without oozing drainage  Neurological:     Mental Status: He is alert and oriented to person, place, and time.  Psychiatric:        Behavior: Behavior normal.     Comments: Well groomed, good eye contact, normal speech and thoughts       Results for orders placed or performed in visit on 05/06/17  CoaguChek XS/INR Waived  Result Value Ref Range   INR 4.4 (H) 0.9 - 1.1   Prothrombin Time 53.4 sec      Assessment & Plan:   Problem List Items Addressed This Visit    Dilated cardiomyopathy (HCC)   Relevant Orders   COMPLETE METABOLIC PANEL WITH GFR   CBC with Differential/Platelet    Other Visit Diagnoses    Bilateral lower extremity edema    -  Primary   Relevant Orders   COMPLETE METABOLIC PANEL WITH GFR   Needs flu shot       Relevant Orders   Flu Vaccine QUAD 36+ mos IM   Ulcer of right lower extremity, limited to breakdown of skin (HCC)       Relevant Medications   mupirocin ointment (BACTROBAN) 2 %   cephALEXin (KEFLEX) 500 MG capsule   Other Relevant Orders   CBC with Differential/Platelet   Panic attacks       Relevant Medications   hydrOXYzine (ATARAX/VISTARIL) 10 MG tablet      Complex chronic history of cardiovascular disease S/p AICD Followed by [facility] On medication management for volume control / fluid balance / CHF management Anticoagulation with coumadin, per Cardiology He is off jardiance, not covered by ins. Improved off Cardiac rehab.  Evidence of volume overload today but no acute CHF. No dyspnea, good air movement some diminished at bases  Weight increasing.  Plan Advised he needs to contact [facility] for further guidance, may need adjust other medications, caution low  BP, should monitor BP closely.  He should continue current diuretic regimen - but I can't change this any further he is on Furosemide 40mg  x 2 in AM, or can do 40mg  BID - should do RICE therapy as well   #R lower  extremity stasis ulceration Provoked by cat scratch - Limited by med interactions on his warfarin - Trial on keflex oral antibiotic, and topical mupirocin No secondary cellulitis extending Wound care as discussed, however limited by significant edema If not improving needs IV antibiotic, discussed risk factor return criteria  #Panic Anxiety Not chronic problem Recent unexplained panic Trial on Hydroxyzine PRN, caution with use, sedation  Meds ordered this encounter  Medications  . mupirocin ointment (BACTROBAN) 2 %    Sig: Apply 1 application topically 2 (two) times daily. For up to 7-10 days as needed    Dispense:  22 g    Refill:  0  . hydrOXYzine (ATARAX/VISTARIL) 10 MG tablet    Sig: Take 1 tablet (10 mg total) by mouth every 8 (eight) hours as needed for anxiety.    Dispense:  30 tablet    Refill:  0  . cephALEXin (KEFLEX) 500 MG capsule    Sig: Take 1 capsule (500 mg total) by mouth 3 (three) times daily. For 7 days    Dispense:  21 capsule    Refill:  0     Follow up plan: Return if symptoms worsen or fail to improve.   Saralyn Pilar, DO [facility] 01/11/2020, 3:22 PM

## 2020-01-11 NOTE — Patient Instructions (Addendum)
Thank you for coming to the office today.  Call Cardiology to try to get in sooner than November due to significant worse swelling.  May need new fluid regimen from cardiology  Otherwise, would refer to Vascular vein specialist for swelling.  Ulceration on leg, difficult to heal with swelling. - take antibiotic as prescribed  For now keep on Lasix 40mg  - can take two in AM or one TWICE a day.  We can check blood chemistry next week, schedule lab apt here. And if kidney function is good, we can increase Furosemide further if needed.  If not improving you may need to return for re-evaluation. But if more severe worsening such as spreading redness or streaking redness, significantly larger size, persistent drainage of pus, increased pain, fevers/chills, nausea vomiting and cannot take antibiotic. If significantly worse symptoms or most of these symptoms, would recommend going straight to Hospital Emergency Dept as you may require IV antibiotics instead.    Please schedule a Follow-up Appointment to: Return if symptoms worsen or fail to improve.  If you have any other questions or concerns, please feel free to call the office or send a message through MyChart. You may also schedule an earlier appointment if necessary.  Additionally, you may be receiving a survey about your experience at our office within a few days to 1 week by e-mail or mail. We value your feedback.  , DO [facility], [facility]

## 2020-01-20 ENCOUNTER — Ambulatory Visit: Payer: Medicare Other

## 2020-01-22 ENCOUNTER — Ambulatory Visit (INDEPENDENT_AMBULATORY_CARE_PROVIDER_SITE_OTHER): Payer: Medicare Other

## 2020-01-22 ENCOUNTER — Ambulatory Visit: Payer: Medicare Other

## 2020-01-22 ENCOUNTER — Other Ambulatory Visit: Payer: Self-pay

## 2020-01-22 DIAGNOSIS — Z23 Encounter for immunization: Secondary | ICD-10-CM | POA: Diagnosis not present

## 2020-01-23 LAB — CBC WITH DIFFERENTIAL/PLATELET
Absolute Monocytes: 586 cells/uL (ref 200–950)
Basophils Absolute: 38 cells/uL (ref 0–200)
Basophils Relative: 0.8 %
Eosinophils Absolute: 158 cells/uL (ref 15–500)
Eosinophils Relative: 3.3 %
HCT: 41.7 % (ref 38.5–50.0)
Hemoglobin: 13.5 g/dL (ref 13.2–17.1)
Lymphs Abs: 845 cells/uL — ABNORMAL LOW (ref 850–3900)
MCH: 28.3 pg (ref 27.0–33.0)
MCHC: 32.4 g/dL (ref 32.0–36.0)
MCV: 87.4 fL (ref 80.0–100.0)
MPV: 10.5 fL (ref 7.5–12.5)
Monocytes Relative: 12.2 %
Neutro Abs: 3173 cells/uL (ref 1500–7800)
Neutrophils Relative %: 66.1 %
Platelets: 229 10*3/uL (ref 140–400)
RBC: 4.77 10*6/uL (ref 4.20–5.80)
RDW: 14.9 % (ref 11.0–15.0)
Total Lymphocyte: 17.6 %
WBC: 4.8 10*3/uL (ref 3.8–10.8)

## 2020-01-23 LAB — COMPLETE METABOLIC PANEL WITH GFR
AG Ratio: 1.1 (calc) (ref 1.0–2.5)
ALT: 6 U/L — ABNORMAL LOW (ref 9–46)
AST: 16 U/L (ref 10–40)
Albumin: 4 g/dL (ref 3.6–5.1)
Alkaline phosphatase (APISO): 141 U/L — ABNORMAL HIGH (ref 36–130)
BUN: 18 mg/dL (ref 7–25)
CO2: 28 mmol/L (ref 20–32)
Calcium: 9.4 mg/dL (ref 8.6–10.3)
Chloride: 98 mmol/L (ref 98–110)
Creat: 0.79 mg/dL (ref 0.60–1.35)
GFR, Est African American: 139 mL/min/{1.73_m2} (ref >=60)
GFR, Est Non African American: 120 mL/min/{1.73_m2} (ref >=60)
Globulin: 3.8 g/dL (calc) — ABNORMAL HIGH (ref 1.9–3.7)
Glucose, Bld: 89 mg/dL (ref 65–99)
Potassium: 4.1 mmol/L (ref 3.5–5.3)
Sodium: 137 mmol/L (ref 135–146)
Total Bilirubin: 1.9 mg/dL — ABNORMAL HIGH (ref 0.2–1.2)
Total Protein: 7.8 g/dL (ref 6.1–8.1)

## 2020-03-11 ENCOUNTER — Emergency Department
Admission: EM | Admit: 2020-03-11 | Discharge: 2020-03-11 | Disposition: A | Discharge status: Home / Self Care | Payer: Medicare Other | Attending: Emergency Medicine | Admitting: Emergency Medicine

## 2020-03-11 ENCOUNTER — Emergency Department: Payer: Medicare Other

## 2020-03-11 ENCOUNTER — Other Ambulatory Visit: Payer: Self-pay

## 2020-03-11 DIAGNOSIS — A419 Sepsis, unspecified organism: Secondary | ICD-10-CM

## 2020-03-11 DIAGNOSIS — I110 Hypertensive heart disease with heart failure: Secondary | ICD-10-CM | POA: Diagnosis not present

## 2020-03-11 DIAGNOSIS — R0602 Shortness of breath: Secondary | ICD-10-CM | POA: Diagnosis present

## 2020-03-11 DIAGNOSIS — Z79899 Other long term (current) drug therapy: Secondary | ICD-10-CM | POA: Insufficient documentation

## 2020-03-11 DIAGNOSIS — I509 Heart failure, unspecified: Secondary | ICD-10-CM | POA: Insufficient documentation

## 2020-03-11 DIAGNOSIS — Z7901 Long term (current) use of anticoagulants: Secondary | ICD-10-CM | POA: Insufficient documentation

## 2020-03-11 DIAGNOSIS — U071 COVID-19: Secondary | ICD-10-CM | POA: Insufficient documentation

## 2020-03-11 DIAGNOSIS — Z95810 Presence of automatic (implantable) cardiac defibrillator: Secondary | ICD-10-CM | POA: Diagnosis not present

## 2020-03-11 LAB — CBC WITH DIFFERENTIAL/PLATELET
Abs Immature Granulocytes: 0.03 10*3/uL (ref 0.00–0.07)
Basophils Absolute: 0 10*3/uL (ref 0.0–0.1)
Basophils Relative: 0 %
Eosinophils Absolute: 0 10*3/uL (ref 0.0–0.5)
Eosinophils Relative: 1 %
HCT: 43.9 % (ref 39.0–52.0)
Hemoglobin: 13.9 g/dL (ref 13.0–17.0)
Immature Granulocytes: 1 %
Lymphocytes Relative: 27 %
Lymphs Abs: 1.2 10*3/uL (ref 0.7–4.0)
MCH: 28 pg (ref 26.0–34.0)
MCHC: 31.7 g/dL (ref 30.0–36.0)
MCV: 88.5 fL (ref 80.0–100.0)
Monocytes Absolute: 0.4 10*3/uL (ref 0.1–1.0)
Monocytes Relative: 8 %
Neutro Abs: 2.8 10*3/uL (ref 1.7–7.7)
Neutrophils Relative %: 63 %
Platelets: 133 10*3/uL — ABNORMAL LOW (ref 150–400)
RBC: 4.96 MIL/uL (ref 4.22–5.81)
RDW: 15.1 % (ref 11.5–15.5)
WBC: 4.4 10*3/uL (ref 4.0–10.5)
nRBC: 0 % (ref 0.0–0.2)

## 2020-03-11 LAB — BRAIN NATRIURETIC PEPTIDE: B Natriuretic Peptide: >4500 pg/mL — ABNORMAL HIGH (ref 0.0–100.0)

## 2020-03-11 LAB — PROTIME-INR
INR: 3.8 — ABNORMAL HIGH (ref 0.8–1.2)
Prothrombin Time: 36.6 seconds — ABNORMAL HIGH (ref 11.4–15.2)

## 2020-03-11 LAB — COMPREHENSIVE METABOLIC PANEL
ALT: 10 U/L (ref 0–44)
AST: 23 U/L (ref 15–41)
Albumin: 3.7 g/dL (ref 3.5–5.0)
Alkaline Phosphatase: 151 U/L — ABNORMAL HIGH (ref 38–126)
Anion gap: 12 (ref 5–15)
BUN: 15 mg/dL (ref 6–20)
CO2: 27 mmol/L (ref 22–32)
Calcium: 8.8 mg/dL — ABNORMAL LOW (ref 8.9–10.3)
Chloride: 96 mmol/L — ABNORMAL LOW (ref 98–111)
Creatinine, Ser: 0.78 mg/dL (ref 0.61–1.24)
GFR, Estimated: >60 mL/min (ref >60)
Glucose, Bld: 95 mg/dL (ref 70–99)
Potassium: 4.1 mmol/L (ref 3.5–5.1)
Sodium: 135 mmol/L (ref 135–145)
Total Bilirubin: 2.1 mg/dL — ABNORMAL HIGH (ref 0.3–1.2)
Total Protein: 8.4 g/dL — ABNORMAL HIGH (ref 6.5–8.1)

## 2020-03-11 LAB — LACTIC ACID, PLASMA: Lactic Acid, Venous: 1.8 mmol/L (ref 0.5–1.9)

## 2020-03-11 LAB — TROPONIN I (HIGH SENSITIVITY): Troponin I (High Sensitivity): 31 ng/L — ABNORMAL HIGH (ref <18)

## 2020-03-11 NOTE — Discharge Instructions (Addendum)
Please seek medical attention for any high fevers, chest pain, shortness of breath, change in behavior, persistent vomiting, bloody stool or any other new or concerning symptoms.  

## 2020-03-11 NOTE — ED Triage Notes (Signed)
Pt to ED via recliner from [facility]. Per [facility] staff pt tested +for covid today, had regeneron infusion at [facility], pt then became apneic with periods of apnea lasting approx 4-5 seconds. Per [facility] staff pt is on the heart transplant list at [facility], pt with significant CHF, per [facility] RN pt did not receive total amount of fluids normal to Regeneron infusion. Pt noted to be jaundiced, pale, pt noted to be on 2L O2 via [place] with no previous hx of O2 use. Pt with noted accessory muscle use as well.

## 2020-03-11 NOTE — ED Provider Notes (Signed)
[facility] Emergency Department Provider Note   ____________________________________________   I have reviewed the triage vital signs and the nursing notes.   HISTORY  Chief Complaint Shortness of Breath   History limited by: Not Limited   HPI Gary Davila is a 29 y.o. male who presents to the emergency department today from infusion clinic because of concern for difficulty with breathing. The patient was recently diagnosed with COVID. He feels like his symptoms have been present for roughly the past week. He has not had any significant shortness of breath but has had some cough prior to today. Went to infusion clinic today where it sounds like he might have fallen asleep and had some brief episodes of apnea. The patient denies any significant shortness of breath on my exam but states he does feel slightly short of breath. Denies any history of lung disease. Does have significant heart history.   Records reviewed. Per medical record review patient has a history of coarctation of the aorta, dilated cardiomyopathy.   Past Medical History:  Diagnosis Date  . A-fib ([place])   . Chronic CHF (congestive heart failure) ([place])   . Coarctation of aorta   . Dilated cardiomyopathy ([place])   . Hypertension   . ICD (implantable cardioverter-defibrillator) in place   . ICD (implantable cardioverter-defibrillator) lead failure   . 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 Left ventricular diastolic dysfunction, NYHA class 2   . Obesity   . Ventricular tachycardia [facility])     Patient Active Problem List   Diagnosis Date Noted  . ICD (implantable cardioverter-defibrillator) in place 05/09/2017  . Hypertension   . Obesity (BMI 30.0-34.9)   . Chronic CHF (congestive heart failure) ([place])   . A-fib ([place])   . Ventricular tachycardia ([place])   . Dilated cardiomyopathy ([place])   . Coarctation of aorta   . Heart failure with reduced ejection fraction due to cardiomyopathy ([place])   .  History of implantable cardioverter-defibrillator (ICD) placement 05/01/2014  . Long term (current) use of anticoagulants 03/02/2011    Past Surgical History:  Procedure Laterality Date  . CARDIAC CATHETERIZATION  01/31/2017  . ICD GENERATOR REMOVAL  11/29/09, 12/27/09, 01/31/17  . ICD IMPLANT  06/24/03, 02/22/10, 01/31/17  . THORACIC AORTA STENT  06/06/2013    Prior to Admission medications   Medication Sig Start Date End Date Taking? Authorizing Provider  carvedilol (COREG) 25 MG tablet Taking 12.5 mg taking twice daily. 08/26/18   [provider]  cephALEXin (KEFLEX) 500 MG capsule Take 1 capsule (500 mg total) by mouth 3 (three) times daily. For 7 days 01/11/20   Olin Hauser, DO  diclofenac Sodium (VOLTAREN) 1 % GEL Apply 2 g topically 3 (three) times dailyas needed. 06/29/19   Karamalegos, Devonne Doughty, DO  digoxin (LANOXIN) 0.125 MG tablet TAKE 1 TABLET DAILY. 02/04/19   [provider]  eplerenone (INSPRA) 25 MG tablet TAKE 1 TABLET DAILY. 08/26/18   [provider]  furosemide (LASIX) 40 MG tablet Take 1 tablet (40 mg total) by mouth 2 (two) times daily. For 1-2 weeks trial, then follow-up with Cardiologist - previously was on 84m daily in AM only. 11/10/19   Karamalegos, ADevonne Doughty DO  hydrOXYzine (ATARAX/VISTARIL) 10 MG tablet Take 1 tablet (10 mg total) by mouth every 8 (eight) hours as needed for anxiety. 01/11/20   Karamalegos, ADevonne Doughty DO  ibuprofen (ADVIL,MOTRIN) 200 MG tablet Take by mouth.    [provider]  mupirocin ointment (  BACTROBAN) 2 % Apply 1 application topically 2 (two) times daily. For up to 7-10 days as needed 01/11/20   Olin Hauser, DO  sacubitril-valsartan (ENTRESTO) 24-26 MG Take by mouth. 08/26/19 2020-09-13  [provider]  warfarin (COUMADIN) 5 MG tablet Take 7.77m (one and half pill of 518m on Tues / Thurs, then 62m39mvery other day. 11/22/16   [provider]    Allergies Patient has no  known allergies.  Family History  Problem Relation Age of Onset  . Cancer Maternal Grandmother   . Stroke Maternal Grandmother   . Heart attack Father 62 8 Social History Social History   Tobacco Use  . Smoking status: Never Smoker  . Smokeless tobacco: Never Used  Vaping Use  . Vaping Use: Never used  Substance Use Topics  . Alcohol use: Yes  . Drug use: No    Review of Systems Constitutional: No fever/chills Eyes: No visual changes. ENT: No sore throat. Cardiovascular: Denies chest pain. Respiratory: Positive for cough and shortness of breath. Gastrointestinal: No abdominal pain.  No nausea, no vomiting.  No diarrhea.   Genitourinary: Negative for dysuria. Musculoskeletal: Negative for back pain. Skin: Negative for rash. Neurological: Negative for headaches, focal weakness or numbness.  ____________________________________________   PHYSICAL EXAM:  VITAL SIGNS: ED Triage Vitals  Enc Vitals Group     BP 03/11/20 1533 (!) 90/56     Pulse Rate 03/11/20 1533 67     Resp 03/11/20 1533 (!) 26     Temp 03/11/20 1533 (!) 97.5 F (36.4 C)     Temp Source 03/11/20 1533 Oral     SpO2 03/11/20 1533 100 %     Weight --      Height --      Head Circumference --      Peak Flow --      Pain Score 03/11/20 1541 0   Constitutional: Alert and oriented.  Eyes: Conjunctivae are normal.  ENT      Head: Normocephalic and atraumatic.      Nose: No congestion/rhinnorhea.      Mouth/Throat: Mucous membranes are moist.      Neck: No stridor. Hematological/Lymphatic/Immunilogical: No cervical lymphadenopathy. Cardiovascular: Normal rate, regular rhythm.  No murmurs, rubs, or gallops.  Respiratory: Normal respiratory effort without tachypnea nor retractions. Breath sounds are clear and equal bilaterally. No wheezes/rales/rhonchi. Gastrointestinal: Soft and non tender. No rebound. No guarding.  Genitourinary: Deferred Musculoskeletal: Normal range of motion in all extremities.  No lower extremity edema. Neurologic:  Normal speech and language. No gross focal neurologic deficits are appreciated.  Skin:  Skin is warm, dry and intact. No rash noted. Psychiatric: Mood and affect are normal. Speech and behavior are normal. Patient exhibits appropriate insight and judgment.  ____________________________________________    LABS (pertinent positives/negatives)  CBC wbc 4.4, hgb 13.9, plt 133 Lactic acid 1.8 Trop hs 31 CMP na 135, k 4.1, glu 95, cr 0.78, alk phos 151, t bili 2.1 BNP >4,500 ____________________________________________   EKG  I, GraNance Pearttending physician, personally viewed and interpreted this EKG  EKG Time: 1545 Rate: 72 Rhythm: sinus rhythm with pvc Axis: left axis deviation Intervals: qtc 462 QRS: wide ST changes: no st elevation Impression: abnormal ekg  ____________________________________________    RADIOLOGY  CXR Cardiomegaly. Small pleural effusions. Hazy airspace opacity in the right lung base.  ____________________________________________   PROCEDURES  Procedures  ____________________________________________   INITIAL IMPRESSION / ASSESSMENT AND PLAN / ED COURSE  Pertinent  labs & imaging results that were available during my care of the patient were reviewed by me and considered in my medical decision making (see chart for details).   Patient presented to the emergency department today from [facility] infusion clinic because of concern for shortness of breath. Patient recently diagnosed with COVID. Patient not hypoxic here nor in any acute respiratory distress. Patient does have significant history of heart disease and BNP is elevated, however per chart review had significantly elevated BNP recently. CXR shows a hazy opacity in the right lung base, which I would think is related to COVID diagnosis. No leukocytosis. Patient himself denies any significant shortness of breath. States that he would like to go home. Will  plan on discharging home.   ____________________________________________   FINAL CLINICAL IMPRESSION(S) / ED DIAGNOSES  COVID-19  Note: This dictation was prepared with Dragon dictation. Any transcriptional errors that result from this process are unintentional     Nance Pear, MD 03/11/20 [phone]

## 2020-03-11 NOTE — ED Notes (Signed)
Pt oob in room. Pox 96% prior to ambulation, 99-100% during ambulation, 95% post

## 2020-08-21 DEATH — deceased

## 2020-09-21 DEATH — deceased

## 2022-03-19 IMAGING — DX DG CHEST 1V
1 series · 1 of 1 positions shown · non-contrast
Comparison: None.

CLINICAL DATA: Chest pain

EXAM:
CHEST  1 VIEW

[chest ap]
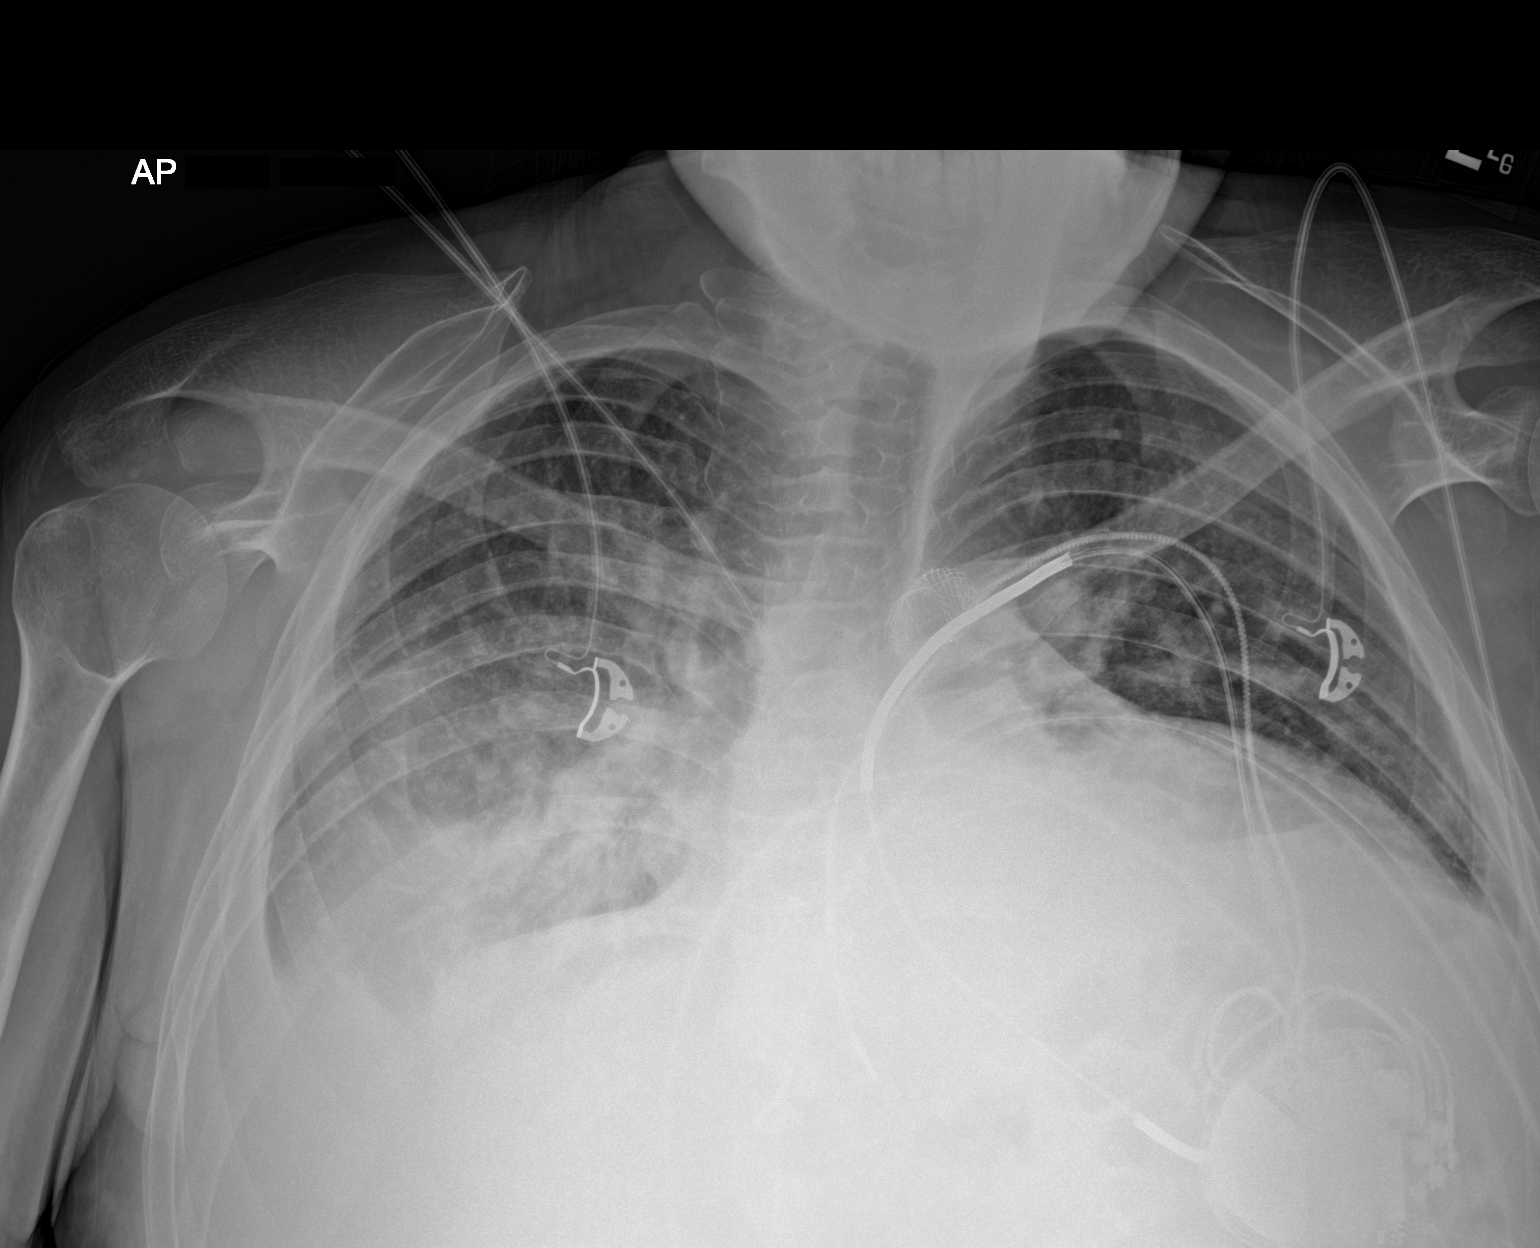

[1 of 1 positions shown; findings below may reference images not displayed]

FINDINGS: The heart size and mediastinal contours are mildly enlarged. There
is small bilateral pleural effusions. There is hazy airspace opacity
seen at the right lung base.
IMPRESSION: Mild cardiomegaly and small bilateral pleural effusions.

Hazy airspace opacity at the right lung base may be due to
atelectasis and/or infectious etiology.
